# Patient Record
Sex: Male | Born: 1985 | Race: White | Hispanic: No | Marital: Single | Smoking: Never smoker
Health system: Southern US, Community
[De-identification: ages and names within clinical notes are randomized; demographics above are authoritative.]

## PROBLEM LIST (undated history)

## (undated) ENCOUNTER — Encounter
Attending: Student in an Organized Health Care Education/Training Program | Primary: Student in an Organized Health Care Education/Training Program

## (undated) ENCOUNTER — Encounter

## (undated) ENCOUNTER — Encounter: Attending: Dermatology | Primary: Dermatology

## (undated) ENCOUNTER — Telehealth

## (undated) ENCOUNTER — Ambulatory Visit: Payer: PRIVATE HEALTH INSURANCE

## (undated) ENCOUNTER — Ambulatory Visit: Attending: Dermatology | Primary: Dermatology

## (undated) ENCOUNTER — Ambulatory Visit

## (undated) ENCOUNTER — Ambulatory Visit
Attending: Student in an Organized Health Care Education/Training Program | Primary: Student in an Organized Health Care Education/Training Program

## (undated) ENCOUNTER — Encounter: Payer: PRIVATE HEALTH INSURANCE | Attending: Dermatology | Primary: Dermatology

## (undated) ENCOUNTER — Ambulatory Visit
Payer: BLUE CROSS/BLUE SHIELD | Attending: Student in an Organized Health Care Education/Training Program | Primary: Student in an Organized Health Care Education/Training Program

## (undated) ENCOUNTER — Telehealth: Attending: Dermatology | Primary: Dermatology

## (undated) ENCOUNTER — Non-Acute Institutional Stay: Payer: PRIVATE HEALTH INSURANCE

## (undated) DIAGNOSIS — F419 Anxiety disorder, unspecified: Secondary | ICD-10-CM

## (undated) DIAGNOSIS — F1010 Alcohol abuse, uncomplicated: Secondary | ICD-10-CM

## (undated) HISTORY — PX: WRIST SURGERY: SHX841

---

## 2007-01-12 HISTORY — PX: GASTRIC BYPASS: SHX52

## 2008-01-12 HISTORY — PX: BARIATRIC SURGERY: SHX1103

## 2017-09-20 ENCOUNTER — Other Ambulatory Visit: Payer: Self-pay | Admitting: Orthopaedic Surgery

## 2017-09-20 DIAGNOSIS — S52532A Colles' fracture of left radius, initial encounter for closed fracture: Secondary | ICD-10-CM

## 2017-09-21 ENCOUNTER — Ambulatory Visit
Admission: RE | Admit: 2017-09-21 | Discharge: 2017-09-21 | Disposition: A | Discharge status: Home / Self Care | Payer: Federal, State, Local not specified - PPO | Source: Ambulatory Visit | Attending: Orthopaedic Surgery | Admitting: Orthopaedic Surgery

## 2017-09-21 DIAGNOSIS — S52532A Colles' fracture of left radius, initial encounter for closed fracture: Secondary | ICD-10-CM | POA: Diagnosis not present

## 2017-09-21 DIAGNOSIS — S63012A Subluxation of distal radioulnar joint of left wrist, initial encounter: Secondary | ICD-10-CM | POA: Diagnosis not present

## 2017-09-21 DIAGNOSIS — S52571A Other intraarticular fracture of lower end of right radius, initial encounter for closed fracture: Secondary | ICD-10-CM | POA: Insufficient documentation

## 2017-09-21 DIAGNOSIS — S52611A Displaced fracture of right ulna styloid process, initial encounter for closed fracture: Secondary | ICD-10-CM | POA: Diagnosis not present

## 2017-10-28 ENCOUNTER — Encounter: Payer: Self-pay | Admitting: Emergency Medicine

## 2017-10-28 ENCOUNTER — Emergency Department
Admission: EM | Admit: 2017-10-28 | Discharge: 2017-10-28 | Disposition: A | Discharge status: Home / Self Care | Payer: Federal, State, Local not specified - PPO | Attending: Student in an Organized Health Care Education/Training Program | Admitting: Student in an Organized Health Care Education/Training Program

## 2017-10-28 ENCOUNTER — Other Ambulatory Visit: Payer: Self-pay

## 2017-10-28 DIAGNOSIS — Z046 Encounter for general psychiatric examination, requested by authority: Secondary | ICD-10-CM | POA: Insufficient documentation

## 2017-10-28 DIAGNOSIS — F419 Anxiety disorder, unspecified: Secondary | ICD-10-CM | POA: Diagnosis present

## 2017-10-28 DIAGNOSIS — Z79899 Other long term (current) drug therapy: Secondary | ICD-10-CM | POA: Insufficient documentation

## 2017-10-28 DIAGNOSIS — F410 Panic disorder [episodic paroxysmal anxiety] without agoraphobia: Secondary | ICD-10-CM | POA: Diagnosis not present

## 2017-10-28 DIAGNOSIS — G4700 Insomnia, unspecified: Secondary | ICD-10-CM | POA: Diagnosis not present

## 2017-10-28 HISTORY — DX: Anxiety disorder, unspecified: F41.9

## 2017-10-28 MED ORDER — LORAZEPAM 1 MG PO TABS
1.0000 mg | ORAL_TABLET | Freq: Once | ORAL | Status: AC
  Administered 2017-10-28: 1 mg via ORAL
  Filled 2017-10-28: qty 1

## 2017-10-28 MED ORDER — LORAZEPAM 0.5 MG PO TABS: 0.5000 mg | ORAL_TABLET | Freq: Three times a day (TID) | ORAL | 0 refills | Status: AC | PRN

## 2017-10-28 NOTE — ED Notes (Signed)

## 2017-10-28 NOTE — ED Provider Notes (Signed)
[facility] Emergency Department Provider Note    First MD Initiated Contact with Patient 10/28/17 1050     (approximate)  I have reviewed the triage vital signs and the nursing notes.   HISTORY  Chief Complaint No chief complaint on file.    HPI Hunter Holmes is a 32 y.o. male with a history of anxiety presents the ER for evaluation of panic attack.  States he has been on alprazolam for several months recently switched to Klonopin without any improvement in symptoms.  States his anxiety really flares up prior to starting work as he works as a Public relations account executive.  Denies any SI or HI.  No hallucinations.  No other substance abuse.  Would like a referral to psychiatry.    Past Medical History:  Diagnosis Date  . Anxiety    No family history on file.  There are no active problems to display for this patient.     Prior to Admission medications   Medication Sig Start Date End Date Taking? Authorizing Provider  ALPRAZolam Prudy Feeler) 0.5 MG tablet Take 0.5 mg by mouth 3 (three) times daily as needed for anxiety. 10/17/17  Yes [provider]  buPROPion (WELLBUTRIN XL) 300 MG 24 hr tablet Take 300 mg by mouth daily. 09/04/17  Yes [provider]  Cholecalciferol (VITAMIN D3) 50000 units CAPS Take 1 capsule by mouth once a week. 10/24/17  Yes [provider]  clonazePAM (KLONOPIN) 0.5 MG tablet Take 0.5 mg by mouth 2 (two) times daily as needed for anxiety.  10/24/17  Yes [provider]  furosemide (LASIX) 40 MG tablet Take 40 mg by mouth 2 (two) times daily. 09/10/17  Yes [provider]  ibuprofen (ADVIL,MOTRIN) 800 MG tablet Take 800 mg by mouth 3 (three) times daily. 09/23/17  Yes [provider]  traZODone (DESYREL) 100 MG tablet Take 100 mg by mouth at bedtime. 10/27/17  Yes [provider]  LORazepam (ATIVAN) 0.5 MG tablet Take 1 tablet (0.5 mg total) by mouth every 8 (eight) hours as needed for  anxiety. 10/28/17 10/28/18  Willy Eddy, MD    Allergies Patient has no known allergies.    Social History Social History   Tobacco Use  . Smoking status: Never Smoker  . Smokeless tobacco: Never Used  Substance Use Topics  . Alcohol use: Yes    Alcohol/week: 4.0 standard drinks    Types: 4 Shots of liquor per week    Comment: drinks daily.  . Drug use: Never    Review of Systems Patient denies headaches, rhinorrhea, blurry vision, numbness, shortness of breath, chest pain, edema, cough, abdominal pain, nausea, vomiting, diarrhea, dysuria, fevers, rashes or hallucinations unless otherwise stated above in HPI. ____________________________________________   PHYSICAL EXAM:  VITAL SIGNS: Vitals:   10/28/17 0930  BP: (!) 157/81  Pulse: 83  Temp: 98.6 F (37 C)  SpO2: 98%    Constitutional: Alert and oriented.  Eyes: Conjunctivae are normal.  Head: Atraumatic. Nose: No congestion/rhinnorhea. Mouth/Throat: Mucous membranes are moist.   Neck: No stridor. Painless ROM.  Cardiovascular: Normal rate, regular rhythm. Grossly normal heart sounds.  Good peripheral circulation. Respiratory: Normal respiratory effort.  No retractions. Lungs CTAB. Gastrointestinal: Soft and nontender. No distention. No abdominal bruits. No CVA tenderness. Genitourinary:  Musculoskeletal: No lower extremity tenderness nor edema.  No joint effusions. Neurologic:  Normal speech and language. No gross focal neurologic deficits are appreciated. No facial droop Skin:  Skin is warm, dry and intact. No  rash noted. Psychiatric: Mood and affect are normal. Speech and behavior are normal.  Denies SI or HI.  Normal thought process.  No pressured speech  ____________________________________________   LABS (all labs ordered are listed, but only abnormal results are displayed)  No results found for this or any previous visit (from the past 24  hour(s)). ____________________________________________ ____________________________________________  RADIOLOGY   ____________________________________________   PROCEDURES  Procedure(s) performed:  Procedures    Critical Care performed: no ____________________________________________   INITIAL IMPRESSION / ASSESSMENT AND PLAN / ED COURSE  Pertinent labs & imaging results that were available during my care of the patient were reviewed by me and considered in my medical decision making (see chart for details).   DDX: Anxiety, depression, withdrawal, medication effect  Hunter Holmes is a 32 y.o. who presents to the ED with symptoms as described above consistent with panic attack.  Vital signs are stable.  No other symptoms reported.  No SI or HI.  Significant improvement in symptoms after oral Ativan.  Recommend evaluation by psychiatry.  Patient requesting outpatient referral which I think is also recent will.  Does not meet criteria for IVC.  No indication for further diagnostic testing at this time given adequate outpatient follow-up and no other complaints at this time.  Have discussed with the patient and available family all diagnostics and treatments performed thus far and all questions were answered to the best of my ability. The patient demonstrates understanding and agreement with plan.       As part of my medical decision making, I reviewed the following data within the electronic MEDICAL RECORD NUMBER Nursing notes reviewed and incorporated, Labs reviewed, notes from prior ED visits and [place] Controlled Substance Database   ____________________________________________   FINAL CLINICAL IMPRESSION(S) / ED DIAGNOSES  Final diagnoses:  Anxiety      NEW MEDICATIONS STARTED DURING THIS VISIT:  New Prescriptions   LORAZEPAM (ATIVAN) 0.5 MG TABLET    Take 1 tablet (0.5 mg total) by mouth every 8 (eight) hours as needed for anxiety.     Note:  This document was prepared  using Dragon voice recognition software and may include unintentional dictation errors.    Willy Eddy, MD 10/28/17 1121

## 2017-10-28 NOTE — ED Notes (Signed)
Pt reports increased anxiety over the last couple of weeks  - PCP changed his alprazolam 0.5 tid to Clonazepam 0.5 bid and pt reports anxiety has increased since med change  Insomnia present also  Denies SI/HI  Denies AH/VH

## 2017-10-28 NOTE — ED Triage Notes (Signed)
STates he has very bad anxiety. States he has been having a panic attack all night long.  States currently taking Klonopin.  Recently changed to Klonopin from aprozolam 3 days ago.  Denies SI/ HI.  Calm and cooperative.  NAD

## 2017-10-28 NOTE — ED Notes (Signed)
First Nurse Note: Patient ambulatory to Rm 19H.  Amy Rn aware of placement.

## 2018-02-15 ENCOUNTER — Encounter
Admit: 2018-02-15 | Discharge: 2018-02-16 | Payer: PRIVATE HEALTH INSURANCE | Attending: Dermatology | Primary: Dermatology

## 2018-02-15 DIAGNOSIS — L409 Psoriasis, unspecified: Principal | ICD-10-CM

## 2018-02-15 MED ORDER — KETOCONAZOLE 2 % TOPICAL CREAM
Freq: Every day | TOPICAL | 2 refills | 0.00000 days | Status: CP
Start: 2018-02-15 — End: 2019-02-15

## 2018-02-15 MED ORDER — TRIAMCINOLONE ACETONIDE 0.1 % TOPICAL OINTMENT
Freq: Two times a day (BID) | TOPICAL | 1 refills | 0.00000 days | Status: CP
Start: 2018-02-15 — End: 2019-02-15

## 2018-03-15 ENCOUNTER — Ambulatory Visit
Admit: 2018-03-15 | Discharge: 2018-03-16 | Payer: PRIVATE HEALTH INSURANCE | Attending: Dermatology | Primary: Dermatology

## 2018-03-15 DIAGNOSIS — Z79899 Other long term (current) drug therapy: Principal | ICD-10-CM

## 2018-03-15 DIAGNOSIS — R21 Rash and other nonspecific skin eruption: Principal | ICD-10-CM

## 2018-03-15 DIAGNOSIS — L409 Psoriasis, unspecified: Principal | ICD-10-CM

## 2018-03-15 MED ORDER — CLOBETASOL 0.05 % TOPICAL OINTMENT
Freq: Two times a day (BID) | TOPICAL | 1 refills | 0 days | Status: CP
Start: 2018-03-15 — End: 2018-04-07

## 2018-03-29 DIAGNOSIS — L409 Psoriasis, unspecified: Principal | ICD-10-CM

## 2018-03-29 MED ORDER — CLOBETASOL 0.05 % TOPICAL OINTMENT
Freq: Two times a day (BID) | TOPICAL | 3 refills | 0.00000 days | Status: CP
Start: 2018-03-29 — End: 2018-04-06

## 2018-03-29 MED ORDER — TRIAMCINOLONE ACETONIDE 0.1 % TOPICAL OINTMENT
Freq: Two times a day (BID) | TOPICAL | 4 refills | 0.00000 days | Status: CP
Start: 2018-03-29 — End: 2018-04-06

## 2018-03-29 MED ORDER — ADALIMUMAB PEN CITRATE FREE 40 MG/0.4 ML
SUBCUTANEOUS | 2 refills | 0.00000 days | Status: CP
Start: 2018-03-29 — End: 2018-04-03

## 2018-03-29 NOTE — Unmapped (Signed)
Patient sent an in basket message he needed refills on Triamcinolone and Clobetasol Ointment and also start the Humira

## 2018-04-03 DIAGNOSIS — L409 Psoriasis, unspecified: Principal | ICD-10-CM

## 2018-04-03 MED ORDER — ADALIMUMAB PEN CITRATE FREE 40 MG/0.4 ML: each | 2 refills | 0 days | Status: AC

## 2018-04-03 MED ORDER — ADALIMUMAB PEN CITRATE FREE 40 MG/0.4 ML
SUBCUTANEOUS | 2 refills | 0.00000 days | Status: CP
Start: 2018-04-03 — End: 2018-04-05

## 2018-04-05 DIAGNOSIS — L409 Psoriasis, unspecified: Principal | ICD-10-CM

## 2018-04-05 MED ORDER — ADALIMUMAB PEN CITRATE FREE 40 MG/0.4 ML
PEN_INJECTOR | SUBCUTANEOUS | 6 refills | 0.00000 days | Status: CP
Start: 2018-04-05 — End: 2018-04-06

## 2018-04-05 NOTE — Unmapped (Signed)
Please call office again if this quantity is also unable to be dispensed. I am assuming 2 pens per pack, but please provide patient with 3 month supply of medication.

## 2018-04-06 DIAGNOSIS — L409 Psoriasis, unspecified: Principal | ICD-10-CM

## 2018-04-06 MED ORDER — ADALIMUMAB PEN CITRATE FREE 40 MG/0.4 ML
PEN_INJECTOR | SUBCUTANEOUS | 6 refills | 0.00000 days | Status: CP
Start: 2018-04-06 — End: ?

## 2018-04-07 ENCOUNTER — Ambulatory Visit: Admit: 2018-04-07 | Discharge: 2018-04-08 | Payer: PRIVATE HEALTH INSURANCE

## 2018-04-07 DIAGNOSIS — L409 Psoriasis, unspecified: Principal | ICD-10-CM

## 2018-04-07 MED ORDER — CLOBETASOL 0.05 % TOPICAL OINTMENT
Freq: Two times a day (BID) | TOPICAL | 1 refills | 0 days | Status: CP
Start: 2018-04-07 — End: 2018-07-12

## 2018-04-07 NOTE — Unmapped (Signed)
Dermatology Follow-Up Note    Assessment and Plan:      (1) Biopsy proven plaque psoriasis:    ?? Citrate free humira has been approved and will be delivered to his home tomorrow  ?? I spoke at length with him about how to inject the medication  ?? Continue topical treatment as needed    F/U in 6 months with Dr. Carles Collet       Subjective:     REASON FOR VISIT: established patient here for evaluation of skin rash      Tyler Bradshaw is a 33 y.o. yo male who was last seen 03/15/18.      Lesions biopsied during last visit:    Final Diagnosis   Date Value Ref Range Status   03/15/2018   Final    Right back, punch  - Psoriasis        Pt is here today for evaluation of skin rash    Rash  Location: face, trunk, extremities  Duration: years  Character: scaly, red  Treatment(s): topical steroids   Aggravating factor(s): ?   Alleviating factor(s): clobetasol has helped     Previous Treatments: above    No other concerns.     Relevant History:     Meds, Allergies, Past Medical History, Social History, Family History reviewed in Epic    Review of Systems:     The balance of 10 systems is negative unless otherwise documented.    Objective:      Physical Exam: Patient is a well developed african [place] male in no apparent distress.  Alert and oriented x 3.  Skin: Focused examination of the patient's scalp, face, neck, chest, back, bilateral upper extremities (pt declined full skin check) was performed and all areas not specifically commented on were within normal limits:  -- well-demarcated scaly red plaques on the abdomen, back, proximal and distal upper and lower extremities

## 2018-04-17 NOTE — Unmapped (Signed)
Received fax needing clarification on Tyler Bradshaw' Humira. I called the number on the fax multiple times and kept getting a wrong number signal. I then called the 800 number on the prescription and was unable to talk with anyone. I left a VM asking them to call our nurse line to let us know what needs to be clarified and the best number to reach them.

## 2018-04-20 NOTE — Unmapped (Signed)
Called [facility] again. Was again on hold for 20 minutes. Finally spoke with the pharmacist and clarified the order for Humira which will be sent out immediately.

## 2018-04-20 NOTE — Unmapped (Signed)
Received a fax requesting clarification on [name] Humira. Called [phone] and was on hold for 20 minutes. Will try again after finishing up a few other calls that won't take as long.

## 2018-04-22 ENCOUNTER — Emergency Department
Admission: EM | Admit: 2018-04-22 | Discharge: 2018-04-22 | Disposition: A | Discharge status: Home / Self Care | Payer: Federal, State, Local not specified - PPO | Attending: Student in an Organized Health Care Education/Training Program | Admitting: Student in an Organized Health Care Education/Training Program

## 2018-04-22 ENCOUNTER — Other Ambulatory Visit: Payer: Self-pay

## 2018-04-22 ENCOUNTER — Emergency Department: Payer: Federal, State, Local not specified - PPO

## 2018-04-22 DIAGNOSIS — Z20828 Contact with and (suspected) exposure to other viral communicable diseases: Secondary | ICD-10-CM | POA: Diagnosis not present

## 2018-04-22 DIAGNOSIS — R0602 Shortness of breath: Secondary | ICD-10-CM | POA: Diagnosis present

## 2018-04-22 DIAGNOSIS — Z79899 Other long term (current) drug therapy: Secondary | ICD-10-CM | POA: Diagnosis not present

## 2018-04-22 DIAGNOSIS — R69 Illness, unspecified: Secondary | ICD-10-CM

## 2018-04-22 DIAGNOSIS — J111 Influenza due to unidentified influenza virus with other respiratory manifestations: Secondary | ICD-10-CM | POA: Insufficient documentation

## 2018-04-22 LAB — COMPREHENSIVE METABOLIC PANEL
ALT: 21 U/L (ref 0–44)
AST: 31 U/L (ref 15–41)
Albumin: 4.5 g/dL (ref 3.5–5.0)
Alkaline Phosphatase: 112 U/L (ref 38–126)
Anion gap: 17 — ABNORMAL HIGH (ref 5–15)
BUN: 9 mg/dL (ref 6–20)
CO2: 26 mmol/L (ref 22–32)
Calcium: 9.1 mg/dL (ref 8.9–10.3)
Chloride: 95 mmol/L — ABNORMAL LOW (ref 98–111)
Creatinine, Ser: 0.74 mg/dL (ref 0.61–1.24)
GFR calc Af Amer: >60 mL/min (ref >60)
GFR calc non Af Amer: >60 mL/min (ref >60)
Glucose, Bld: 104 mg/dL — ABNORMAL HIGH (ref 70–99)
Potassium: 3.4 mmol/L — ABNORMAL LOW (ref 3.5–5.1)
Sodium: 138 mmol/L (ref 135–145)
Total Bilirubin: 0.6 mg/dL (ref 0.3–1.2)
Total Protein: 9.4 g/dL — ABNORMAL HIGH (ref 6.5–8.1)

## 2018-04-22 LAB — CBC WITH DIFFERENTIAL/PLATELET
Abs Immature Granulocytes: 0.02 10*3/uL (ref 0.00–0.07)
Basophils Absolute: 0 10*3/uL (ref 0.0–0.1)
Basophils Relative: 1 %
Eosinophils Absolute: 0 10*3/uL (ref 0.0–0.5)
Eosinophils Relative: 0 %
HCT: 41.4 % (ref 39.0–52.0)
Hemoglobin: 13.6 g/dL (ref 13.0–17.0)
Immature Granulocytes: 0 %
Lymphocytes Relative: 31 %
Lymphs Abs: 2 10*3/uL (ref 0.7–4.0)
MCH: 28 pg (ref 26.0–34.0)
MCHC: 32.9 g/dL (ref 30.0–36.0)
MCV: 85.2 fL (ref 80.0–100.0)
Monocytes Absolute: 0.5 10*3/uL (ref 0.1–1.0)
Monocytes Relative: 8 %
Neutro Abs: 4 10*3/uL (ref 1.7–7.7)
Neutrophils Relative %: 60 %
Platelets: 332 10*3/uL (ref 150–400)
RBC: 4.86 MIL/uL (ref 4.22–5.81)
RDW: 14.8 % (ref 11.5–15.5)
WBC: 6.6 10*3/uL (ref 4.0–10.5)
nRBC: 0 % (ref 0.0–0.2)

## 2018-04-22 LAB — INFLUENZA PANEL BY PCR (TYPE A & B)
Influenza A By PCR: NEGATIVE
Influenza B By PCR: NEGATIVE

## 2018-04-22 MED ORDER — ALPRAZOLAM 0.5 MG PO TABS
0.5000 mg | ORAL_TABLET | Freq: Once | ORAL | Status: AC
  Administered 2018-04-22: 0.5 mg via ORAL
  Filled 2018-04-22: qty 1

## 2018-04-22 MED ORDER — SODIUM CHLORIDE 0.9 % IV BOLUS
250.0000 mL | Freq: Once | INTRAVENOUS | Status: AC
  Administered 2018-04-22: 18:00:00 250 mL via INTRAVENOUS

## 2018-04-22 MED ORDER — ACETAMINOPHEN 500 MG PO TABS
1000.0000 mg | ORAL_TABLET | Freq: Once | ORAL | Status: AC
  Administered 2018-04-22: 17:00:00 1000 mg via ORAL
  Filled 2018-04-22: qty 2

## 2018-04-22 NOTE — ED Triage Notes (Addendum)
Pt arrives to ED c/o of fever this AM (low grade per pt) and SOB. Works in [employer]. 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 States BP was elevated this AM as well. C/o body aches. 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

## 2018-04-22 NOTE — Discharge Instructions (Addendum)
You have a viral illness which can have symptoms like muscle aches, fevers, chills, runny nose, cough, sneezing, sore throat, vomiting or diarrhea. One of the viruses that can cause this is SARS- CoV-2, the virus that causes COVID-19, also known as the novel coronavirus. You could also have a different viral infection such as the common cold or flu. Most patients with viral illness including COVID-19 have mild symptoms and recover on their own. Resting, staying hydrated, and sleeping are helpful. Today we think you are well enough to go home and treat your symptoms with oral liquids, and medicine for fevers, cough, and pain. ° °We generally do not do COVID-19 testing on people with mild symptoms who are being discharged from the Emergency Department or Clinic.  ° °If we did a test for COVID-19 the results will not be available for several days. We will call you with the result. Please DO NOT CONTACT THE EMERGENCY DEPARTMENT OR CLINIC FOR RESULTS OF THIS TEST.  ° °Please follow the precautions below:  °Stay home for at least 7 days after your symptoms began OR for 3 days after your fever ends, whichever takes longer. ? ° °If people live with you they should also stay home and avoid contact with others for 14 days.? ° ° °ISOLATION GUIDANCE FOR POSSIBLE COVID °Most people with cough and fever have an illness caused by a virus. One is COVID-19. Not all people with these infections are being tested for the virus that causes COVID-19. People who might have COVID but are not being tested should still try to prevent the spread of the infection. ° °These instructions are modified recommendations from the [place] Department of Health and Human Services. ° °People who might have COVID-19 should follow the instructions below until a doctor or health department says they can stop. ° °Stay home unless you need to see a doctor °Stop doing things outside your home except for getting medical care. Do not go to work, school,  or public areas; and do not use public transportation or taxis. ° °Call ahead before visiting the doctor °Before your appointment, call the doctor's office and tell them about your symptoms. This will help them take steps to keep other people from getting infected.  ° °Keep track of your symptoms °Symptoms are the things you feel, like fever or trouble breathing. Go to your doctor or the ER if you think you are getting worse, like having more trouble breathing. Call the doctor's office and tell them about your symptoms. This will help them take steps to keep other people from getting sick.  ° °Wear a face mask °You should wear a face mask that covers your nose and mouth when you are in the same room with other people and when you visit the doctor's office. People who live with you or visit you should also wear a face mask when they are in the same room with you. ° °Separate yourself from other people in your home °You should stay in a different room from other people in your home. You should stay separate from your family members as much as possible. You should use a separate bathroom if you can. ° °Avoid sharing things in your house °Don't share dishes, drinking glasses, cups, eating utensils, towels, bedding, or other things with people in your home. After using these things please wash them really well with soap and water. ° °Cover your coughs and sneezes °Cover your mouth and nose with a tissue when you   cough or sneeze, or you can cough or sneeze into your sleeve. Throw used tissues in a trash can that has a bag in it, and immediately wash your hands with soap and water for at least 20 seconds. If you use an alcohol-based hand rub please rub your hands together for 20 seconds. ° °Wash your hands °Wash your hands often and very well with soap and water for at least 20 seconds. If your hands are not visibly dirty you can use an alcohol-based hand sanitizer. Don't touch your eyes, nose, or mouth with unwashed  hands. ° ° ° °Instructions for People Helping Care for Patients with Possible COVID-19 °Follow your doctor's instructions °Make sure that you understand and can help the patient follow any instructions for care. ° °Provide for the patient's basic needs °You should help the patient with basic needs in the home and provide support for getting groceries, prescriptions, and other personal needs. ° °Keep track of the patient's symptoms °If they are getting sicker, call his or her doctor. This will help the doctor's office take steps to keep other people from getting infected. ° °Limit the number of people who have contact with the patient °If possible, have only one caregiver for the patient. °Other family members should stay in another home or place of residence. If they can't, they should stay in another room and stay separated from the patient as much as possible. °Keep one bathroom JUST for the patient if you can.  °Only allow visitors if they MUST be in the home. ° °Keep older adults, very young children, and other sick people away from the patient °Keep older adults, very young children, and people who have compromised immune systems or chronic health conditions away from the patient. This includes people with chronic heart, lung, or kidney conditions, diabetes, and cancer. ° °Wash your hands often °Avoid touching your eyes, nose, and mouth with unwashed hands. °Wash your hands often and thoroughly with soap and water for at least 20 seconds. You can use an alcohol-based hand sanitizer if soap and water are not available and if your hands are not visibly dirty.  °Use disposable paper towels to dry your hands. If not available, use dedicated cloth towels and replace them when they become wet. ° °Avoid contamination from face masks and gloves °Wear a disposable face mask and gloves whenever you are touching the patient, things in their room or bathroom, or things that can have their body fluid on them, like bedding  or dishes, or blood, vomit, urine, or feces (poop).  °Ensure the mask fits over your nose and mouth tightly, and do not touch it at all while you are wearing it. °Throw out disposable facemasks and gloves after using them. Do not reuse. °Wash your hands immediately after removing your facemask and gloves. °If your personal clothing becomes dirty with a patient's body fluids, carefully remove clothing and launder. Wash your hands after handling dirty clothing. °Place all used disposable facemasks, gloves, and other waste in a lined container before disposing them with other household garbage.  °Remove gloves and wash your hands immediately after handling these items. ° °Do not share dishes, glasses, or other household items with the patient °Avoid sharing household items. You should not share dishes, drinking glasses, cups, eating utensils, towels, bedding, or other items with a patient who is confirmed to have, or being evaluated for, COVID-19 infection.  °After the person uses these items, you should wash them very well with soap and   water. ° °Wash laundry thoroughly °Immediately remove and wash clothes or bedding that have blood, body fluids, and/or secretions or excretions, such as sweat, saliva, sputum, nasal mucus, vomit, urine, or feces, on them.  °Wear gloves when handling laundry from the patient.  °Read and follow directions on labels of laundry or clothing items and detergent. In general, wash and dry with the warmest temperatures recommended on the label. ° °Clean all areas the individual has used  °Clean all touchable surfaces, such as counters, tabletops, doorknobs, bathroom fixtures, toilets, phones, keyboards, tablets, and bedside tables, every day. Also, clean any surfaces that may have blood, body fluids, and/or secretions or excretions on them. Wear gloves when cleaning surfaces the patient has come in contact with.  °Use a diluted bleach solution (dilute bleach with 1 part bleach and 10 parts  water) or a household disinfectant with a label that says EPA-registered for coronaviruses. To make a bleach solution at home, add 1 tablespoon of bleach to 1 quart (4 cups) of water. For a larger supply, add ¼ cup of bleach to 1 gallon (16 cups) of water.  °Read labels of cleaning products and follow recommendations provided on product labels. Labels contain instructions for safe and effective use of the cleaning product including precautions you should take when applying the product, such as wearing gloves or eye protection and making sure you have good ventilation during use of the product.  °Remove gloves and wash hands immediately after cleaning. ° °Monitor yourself for signs and symptoms of illness °Caregivers and household members are considered close contacts, should monitor their health, and will be asked to limit movement outside of the home as much as possible.  ° °If you have additional questions °Contact  °Your Doctor or Healthcare Provider °The [facility] website (http://www.[facility].edu/coronavirus) °The [facility] COVID hotline 336-70COVID °Your local health department  ° °This guidance is subject to change. For the most up to date guidance, check the state Department of Health website at [facility].GOV ° °

## 2018-04-22 NOTE — ED Provider Notes (Signed)
[facility] Emergency Department Provider Note    First MD Initiated Contact with Patient 04/22/18 1640     (approximate)  I have reviewed the triage vital signs and the nursing notes.   HISTORY  Chief Complaint Shortness of Breath    HPI Hunter Holmes is a 33 y.o. male symptoms as described above.  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  States it was otherwise feeling well yesterday and then earlier today when he went to work started developing shortness of breath muscle aches some headache and exertional dyspnea.  States that he is on Humira for history of psoriasis also takes Lasix for history of lower extremity edema though he denies any history of lung or cardiac issues.  Did not take anything for fever prior to arrival.  Denies any nausea or vomiting.    Past Medical History:  Diagnosis Date  . Anxiety    History reviewed. No pertinent family history. Past Surgical History:  Procedure Laterality Date  . GASTRIC BYPASS  2009  . WRIST SURGERY     There are no active problems to display for this patient.     Prior to Admission medications   Medication Sig Start Date End Date Taking? Authorizing Provider  ALPRAZolam Prudy Feeler) 0.5 MG tablet Take 0.5 mg by mouth 3 (three) times daily as needed for anxiety. 10/17/17   [provider]  buPROPion (WELLBUTRIN XL) 300 MG 24 hr tablet Take 300 mg by mouth daily. 09/04/17   [provider]  Cholecalciferol (VITAMIN D3) 50000 units CAPS Take 1 capsule by mouth once a week. 10/24/17   [provider]  clonazePAM (KLONOPIN) 0.5 MG tablet Take 0.5 mg by mouth 2 (two) times daily as needed for anxiety.  10/24/17   [provider]  furosemide (LASIX) 40 MG tablet Take 40 mg by mouth 2 (two) times daily. 09/10/17   [provider]  ibuprofen (ADVIL,MOTRIN) 800 MG tablet Take 800 mg by mouth 3 (three) times daily. 09/23/17    [provider]  LORazepam (ATIVAN) 0.5 MG tablet Take 1 tablet (0.5 mg total) by mouth every 8 (eight) hours as needed for anxiety. 10/28/17 10/28/18  Willy Eddy, MD  traZODone (DESYREL) 100 MG tablet Take 100 mg by mouth at bedtime. 10/27/17   [provider]    Allergies Patient has no known allergies.    Social History Social History   Tobacco Use  . Smoking status: Never Smoker  . Smokeless tobacco: Never Used  Substance Use Topics  . Alcohol use: Yes    Alcohol/week: 4.0 standard drinks    Types: 4 Shots of liquor per week    Comment: drinks daily.  . Drug use: Never    Review of Systems Patient denies headaches, rhinorrhea, blurry vision, numbness, shortness of breath, chest pain, edema, cough, abdominal pain, nausea, vomiting, diarrhea, dysuria, fevers, rashes or hallucinations unless otherwise stated above in HPI. ____________________________________________   PHYSICAL EXAM:  VITAL SIGNS: Vitals:   04/22/18 1754 04/22/18 1909  BP: (!) 177/87 (!) 156/73  Pulse: (!) 110 98  Resp: 20 18  Temp:  98.3 F (36.8 C)  SpO2: 100% 99%    Constitutional: Alert and oriented.  Eyes: Conjunctivae are normal.  Head: Atraumatic. Nose: No congestion/rhinnorhea. Mouth/Throat: Mucous membranes are moist.   Neck: No stridor. Painless ROM.  Cardiovascular: mildly tachycardic, regular rhythm. Grossly normal heart sounds.  Good peripheral circulation. Respiratory: Normal respiratory effort.  No  retractions. Lungs CTAB. Gastrointestinal: Soft and nontender. No distention. No abdominal bruits. No CVA tenderness. Genitourinary: deferred Musculoskeletal: No lower extremity tenderness nor edema.  No joint effusions. Neurologic:  Normal speech and language. No gross focal neurologic deficits are appreciated. No facial droop Skin:  Skin is warm, dry and intact. No rash noted. Psychiatric: Mood and affect are normal. Speech and behavior are normal.   ____________________________________________   LABS (all labs ordered are listed, but only abnormal results are displayed)  Results for orders placed or performed during the hospital encounter of 04/22/18 (from the past 24 hour(s))  CBC with Differential/Platelet     Status: None   Collection Time: 04/22/18  4:45 PM  Result Value Ref Range   WBC 6.6 4.0 - 10.5 K/uL   RBC 4.86 4.22 - 5.81 MIL/uL   Hemoglobin 13.6 13.0 - 17.0 g/dL   HCT 45.441.4 09.839.0 - 11.952.0 %   MCV 85.2 80.0 - 100.0 fL   MCH 28.0 26.0 - 34.0 pg   MCHC 32.9 30.0 - 36.0 g/dL   RDW 14.714.8 82.911.5 - 56.215.5 %   Platelets 332 150 - 400 K/uL   nRBC 0.0 0.0 - 0.2 %   Neutrophils Relative % 60 %   Neutro Abs 4.0 1.7 - 7.7 K/uL   Lymphocytes Relative 31 %   Lymphs Abs 2.0 0.7 - 4.0 K/uL   Monocytes Relative 8 %   Monocytes Absolute 0.5 0.1 - 1.0 K/uL   Eosinophils Relative 0 %   Eosinophils Absolute 0.0 0.0 - 0.5 K/uL   Basophils Relative 1 %   Basophils Absolute 0.0 0.0 - 0.1 K/uL   Immature Granulocytes 0 %   Abs Immature Granulocytes 0.02 0.00 - 0.07 K/uL  Comprehensive metabolic panel     Status: Abnormal   Collection Time: 04/22/18  4:45 PM  Result Value Ref Range   Sodium 138 135 - 145 mmol/L   Potassium 3.4 (L) 3.5 - 5.1 mmol/L   Chloride 95 (L) 98 - 111 mmol/L   CO2 26 22 - 32 mmol/L   Glucose, Bld 104 (H) 70 - 99 mg/dL   BUN 9 6 - 20 mg/dL   Creatinine, Ser 1.300.74 0.61 - 1.24 mg/dL   Calcium 9.1 8.9 - 86.510.3 mg/dL   Total Protein 9.4 (H) 6.5 - 8.1 g/dL   Albumin 4.5 3.5 - 5.0 g/dL   AST 31 15 - 41 U/L   ALT 21 0 - 44 U/L   Alkaline Phosphatase 112 38 - 126 U/L   Total Bilirubin 0.6 0.3 - 1.2 mg/dL   GFR calc non Af Amer >60 >60 mL/min   GFR calc Af Amer >60 >60 mL/min   Anion gap 17 (H) 5 - 15  Influenza panel by PCR (type A & B)     Status: None   Collection Time: 04/22/18  5:08 PM  Result Value Ref Range   Influenza A By PCR NEGATIVE NEGATIVE   Influenza B By PCR NEGATIVE NEGATIVE    ____________________________________________ ____________________________________________  RADIOLOGY  I personally reviewed all radiographic images ordered to evaluate for the above acute complaints and reviewed radiology reports and findings.  These findings were personally discussed with the patient.  Please see medical record for radiology report.  ____________________________________________   PROCEDURES  Procedure(s) performed:  Procedures    Critical Care performed: no ____________________________________________   INITIAL IMPRESSION / ASSESSMENT AND PLAN / ED COURSE  Pertinent labs & imaging results that were available during my care of the  patient were reviewed by me and considered in my medical decision making (see chart for details).   DDX: uri, ili, pna, deydraion, covid 19  Hunter Holmes is a 33 y.o. who presents to the ED with was as described above.  Patient with low-grade temperature and symptoms concerning for viral illness.  Patient with known exposures coming from work.  Patient tested for influenza which is negative therefore will be tested for COVID-19.  Presentation complicated by patient's history of immunosuppression on Humira however his blood work and work-up in the ER is reassuring.  He is tolerating oral hydration without any hypoxia.  Chest x-ray shows no evidence of infiltrates.  I do believe he is stable and appropriate for a period of observation.  We discussed strict return precautions and signs and symptoms which he should return to the ER.  Have discussed with the patient and available family all diagnostics and treatments performed thus far and all questions were answered to the best of my ability. The patient demonstrates understanding and agreement with plan.      The patient was evaluated in Emergency Department today for the symptoms described in the history of present illness. He/she was evaluated in the context of the global COVID-19 pandemic,  which necessitated consideration that the patient might be at risk for infection with the SARS-CoV-2 virus that causes COVID-19. Institutional protocols and algorithms that pertain to the evaluation of patients at risk for COVID-19 are in a state of rapid change based on information released by regulatory bodies including the CDC and federal and state organizations. These policies and algorithms were followed during the patient's care in the ED.  As part of my medical decision making, I reviewed the following data within the electronic MEDICAL RECORD NUMBER Nursing notes reviewed and incorporated, Labs reviewed, notes from prior ED visits.   ____________________________________________   FINAL CLINICAL IMPRESSION(S) / ED DIAGNOSES  Final diagnoses:  Influenza-like illness      NEW MEDICATIONS STARTED DURING THIS VISIT:  Discharge Medication List as of 04/22/2018  7:10 PM       Note:  This document was prepared using Dragon voice recognition software and may include unintentional dictation errors.    Willy Eddy, MD 04/22/18 2045

## 2018-04-24 ENCOUNTER — Telehealth: Payer: Self-pay | Admitting: Family Medicine

## 2018-04-24 LAB — NOVEL CORONAVIRUS, NAA (HOSP ORDER, SEND-OUT TO REF LAB; TAT 18-24 HRS): SARS-CoV-2, NAA: NOT DETECTED

## 2018-04-24 NOTE — Telephone Encounter (Signed)
Pt called looking for his COVID-19 test results. It was explained to patient that his pCP will get the results and should call him with them when they are available.  Pt was advised that results could take up to 7 days. Pt verbalized understanding.

## 2018-04-25 ENCOUNTER — Telehealth: Payer: Self-pay | Admitting: Emergency Medicine

## 2018-04-25 NOTE — Telephone Encounter (Addendum)
Called patient to inform of negative covid 19 result.  Call would not go through at this time. Patient came to ed around 11am and asking for result. I gave him result and sent to [facility] so he could get copy of result for work.

## 2018-06-29 ENCOUNTER — Emergency Department
Admission: EM | Admit: 2018-06-29 | Discharge: 2018-06-29 | Disposition: A | Discharge status: Home / Self Care | Payer: Federal, State, Local not specified - PPO | Attending: Emergency Medicine | Admitting: Emergency Medicine

## 2018-06-29 ENCOUNTER — Other Ambulatory Visit: Payer: Self-pay

## 2018-06-29 DIAGNOSIS — Z5321 Procedure and treatment not carried out due to patient leaving prior to being seen by health care provider: Secondary | ICD-10-CM | POA: Diagnosis not present

## 2018-06-29 DIAGNOSIS — F410 Panic disorder [episodic paroxysmal anxiety] without agoraphobia: Secondary | ICD-10-CM | POA: Diagnosis present

## 2018-06-29 NOTE — ED Notes (Signed)
Pt sitting in lobby with no distress noted; pt reports feeling much better now with no c/o; pt feels as if he has been having panic attack for last 24hrs; taking clonazapam without relief; st seen here for same and given lorazapam with relief; st going home now due to relief of symptoms and will f/u withi his PCP in the morning for such; instr to return for any new or worsening symptoms; pt voices understanding

## 2018-06-29 NOTE — ED Triage Notes (Signed)
Pt c/o feeling shaky and anxious for the past 2 days, states "I have been having a panic attack" states he has a hx of them and is not currently on medications.

## 2018-07-12 MED ORDER — CLOBETASOL 0.05 % TOPICAL OINTMENT
Freq: Two times a day (BID) | TOPICAL | 3 refills | 0.00000 days | Status: CP
Start: 2018-07-12 — End: 2019-07-12

## 2018-07-16 MED ORDER — CLOBETASOL 0.05 % TOPICAL OINTMENT
Freq: Two times a day (BID) | TOPICAL | 1 refills | 0.00000 days | Status: CP
Start: 2018-07-16 — End: 2019-07-16

## 2018-08-16 ENCOUNTER — Encounter
Admit: 2018-08-16 | Discharge: 2018-08-17 | Payer: PRIVATE HEALTH INSURANCE | Attending: Dermatology | Primary: Dermatology

## 2018-08-16 DIAGNOSIS — L409 Psoriasis, unspecified: Principal | ICD-10-CM

## 2018-08-16 MED ORDER — TALTZ AUTOINJECTOR 80 MG/ML SUBCUTANEOUS
3 refills | 0 days | Status: CP
Start: 2018-08-16 — End: 2018-08-17

## 2018-08-16 NOTE — Unmapped (Signed)
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  Dispense: 8 Syringe; Refill: 3        High risk medication use - humira   -last quant gold neg 03/2018  -hep c/b, hiv negative on 03/2018  -denies history of chf, multiple sclerosis, malignancy, also previously discussed uncertain risk of worse outcome in setting of COVID        RTC in 3 months  ______________________________________________________________________    CC:    Chief Complaint   Patient presents with   ??? Psoriasis     states it seems to be doing better, but some days he cannot tell the difference         HPI:  This is a pleasant 32 y.o. male last seen by Dr Archie Balboa on 03/2018 who presents today for  F/up psoriasis.  Primary Concern:  Location: all over body  Duration: a few months  Treatment(s) / Modifying factor(s): humira, which he started in april   Associated symptoms/signs: itching, dark spots left over  States that the humira helped some but he continues to get new spots with a lot of itching. Denies other new, changing, non-healing, tender, or bleeding lesions.  No other skin concerns.      Pertinent PMH:  Reviewed in Epic      ROS: Baseline state of health.  Denies fevers, chills. No other skin complaints except noted per HPI.     PE:  Gen: WD, WN, NAD, A&O  Skin: Per patient request, examination of the head, neck, chest, back, abdomen,, bilateral upper extremities,and bilateral lower extremities was performed and significant for the below. All other areas examined were normal or had no significant findings.   Scaly well-demarcated plaques on the arms, legs, abdomen, back ~10% bsa  Hyperpigmented patches on the trunk arms, legs, abdomen (at sites of previous involvement)

## 2018-08-16 NOTE — Unmapped (Signed)
Per test claim for TALTZ at the [facility], patient needs Medication Assistance Program for Prior Authorization.

## 2018-08-17 MED ORDER — TALTZ AUTOINJECTOR 80 MG/ML SUBCUTANEOUS
SUBCUTANEOUS | 3 refills | 0.00000 days | Status: CP
Start: 2018-08-17 — End: ?

## 2018-09-11 NOTE — Unmapped (Signed)
Patient has called several times requesting a call back from a nurse. He states he has not gotten a call back or his meds. He [facility]  [facility] [phone] ,could a nurse please call them.

## 2018-09-11 NOTE — Unmapped (Signed)
Kia from [facility] called saying they needed a Taltz prescription for [employer]. I called [phone] option 1 and gave a verbal prescription as the original prescription had been sent on 08/17/18.

## 2018-10-03 DIAGNOSIS — L409 Psoriasis, unspecified: Secondary | ICD-10-CM

## 2018-11-27 DIAGNOSIS — L409 Psoriasis, unspecified: Principal | ICD-10-CM

## 2018-11-30 MED ORDER — TALTZ AUTOINJECTOR 80 MG/ML SUBCUTANEOUS
SUBCUTANEOUS | 11 refills | 0.00000 days | Status: CP
Start: 2018-11-30 — End: ?

## 2018-12-29 ENCOUNTER — Ambulatory Visit
Admission: EM | Admit: 2018-12-29 | Discharge: 2018-12-29 | Discharge status: Against Medical Advice | Payer: Worker's Compensation | Attending: Family Medicine | Admitting: Family Medicine

## 2018-12-29 ENCOUNTER — Encounter: Payer: Self-pay | Admitting: Emergency Medicine

## 2018-12-29 ENCOUNTER — Other Ambulatory Visit: Payer: Self-pay
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
## 2018-12-30 ENCOUNTER — Encounter (HOSPITAL_COMMUNITY): Payer: Self-pay

## 2018-12-30 ENCOUNTER — Other Ambulatory Visit: Payer: Self-pay

## 2018-12-30 ENCOUNTER — Ambulatory Visit (HOSPITAL_COMMUNITY)
Admission: EM | Admit: 2018-12-30 | Discharge: 2018-12-30 | Disposition: A | Discharge status: Home / Self Care | Payer: Worker's Compensation | Attending: Emergency Medicine | Admitting: Emergency Medicine

## 2018-12-30 ENCOUNTER — Ambulatory Visit (INDEPENDENT_AMBULATORY_CARE_PROVIDER_SITE_OTHER): Payer: Worker's Compensation

## 2018-12-30 DIAGNOSIS — S93492A Sprain of other ligament of left ankle, initial encounter: Secondary | ICD-10-CM | POA: Diagnosis not present

## 2018-12-30 DIAGNOSIS — X509XXA Other and unspecified overexertion or strenuous movements or postures, initial encounter: Secondary | ICD-10-CM | POA: Diagnosis not present

## 2018-12-30 DIAGNOSIS — I10 Essential (primary) hypertension: Secondary | ICD-10-CM

## 2018-12-30 MED ORDER — IBUPROFEN 600 MG PO TABS: 600.0000 mg | ORAL_TABLET | Freq: Four times a day (QID) | ORAL | 0 refills | Status: DC | PRN
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
## 2018-12-30 NOTE — Discharge Instructions (Addendum)
Your x-ray was negative for fracture.  Wear the ASO as needed for comfort.  Take 600 mg of ibuprofen combined with 1 g of Tylenol 3-4 times a day as needed for pain.  Continue icing and elevating her ankle.  Follow-up with [facility] health employee health and wellness located at [address], [place], [place] [address] if your ankle is still hurting in a week or 2 for reevaluation and possible referral to physical therapy.  Decrease your salt intake. diet and exercise will lower your blood pressure significantly. It is important to keep your blood pressure under good control, as having a elevated blood pressure for prolonged periods of time significantly increases your risk of stroke, heart attacks, kidney damage, eye damage, and other problems. Measure your blood pressure once a day, preferably at the same time every day. Keep a log of this and bring it to your next doctor's appointment.  Bring your blood pressure cuff as well.  Return here in 2 weeks for blood pressure recheck if you're unable to find a primary care physician by then. Return immediately to the ER if you start having chest pain, headache, problems seeing, problems talking, problems walking, if you feel like you're about to pass out, if you do pass out, if you have a seizure, or for any other concerns.  Go to www.goodrx.com to look up your medications. This will give you a list of where you can find your prescriptions at the most affordable prices. Or ask the pharmacist what the cash price is, or if they have any other discount programs available to help make your medication more affordable. This can be less expensive than what you would pay with insurance.   [phone]

## 2019-01-04 NOTE — Unmapped (Signed)
PA initiated for continued therapy of Taltz. Faxed to [facility].

## 2019-02-22 ENCOUNTER — Emergency Department: Payer: Federal, State, Local not specified - PPO

## 2019-02-22 ENCOUNTER — Emergency Department
Admission: EM | Admit: 2019-02-22 | Discharge: 2019-02-22 | Disposition: A | Discharge status: Home / Self Care | Payer: Federal, State, Local not specified - PPO | Attending: Emergency Medicine | Admitting: Emergency Medicine

## 2019-02-22 ENCOUNTER — Encounter: Payer: Self-pay | Admitting: Emergency Medicine

## 2019-02-22 DIAGNOSIS — F10239 Alcohol dependence with withdrawal, unspecified: Secondary | ICD-10-CM

## 2019-02-22 DIAGNOSIS — Z79899 Other long term (current) drug therapy: Secondary | ICD-10-CM | POA: Diagnosis not present

## 2019-02-22 DIAGNOSIS — R748 Abnormal levels of other serum enzymes: Secondary | ICD-10-CM | POA: Diagnosis present

## 2019-02-22 DIAGNOSIS — F410 Panic disorder [episodic paroxysmal anxiety] without agoraphobia: Secondary | ICD-10-CM | POA: Insufficient documentation

## 2019-02-22 DIAGNOSIS — Z20822 Contact with and (suspected) exposure to covid-19: Secondary | ICD-10-CM | POA: Insufficient documentation

## 2019-02-22 DIAGNOSIS — F419 Anxiety disorder, unspecified: Secondary | ICD-10-CM | POA: Diagnosis present

## 2019-02-22 DIAGNOSIS — F10939 Alcohol use, unspecified with withdrawal, unspecified: Secondary | ICD-10-CM

## 2019-02-22 DIAGNOSIS — F10929 Alcohol use, unspecified with intoxication, unspecified: Secondary | ICD-10-CM | POA: Diagnosis present

## 2019-02-22 LAB — CBC
HCT: 38.8 % — ABNORMAL LOW (ref 39.0–52.0)
Hemoglobin: 12.8 g/dL — ABNORMAL LOW (ref 13.0–17.0)
MCH: 28.1 pg (ref 26.0–34.0)
MCHC: 33 g/dL (ref 30.0–36.0)
MCV: 85.1 fL (ref 80.0–100.0)
Platelets: 254 10*3/uL (ref 150–400)
RBC: 4.56 MIL/uL (ref 4.22–5.81)
RDW: 16.7 % — ABNORMAL HIGH (ref 11.5–15.5)
WBC: 5.7 10*3/uL (ref 4.0–10.5)
nRBC: 0 % (ref 0.0–0.2)

## 2019-02-22 LAB — LIPASE, BLOOD: Lipase: 23 U/L (ref 11–51)

## 2019-02-22 LAB — HEPATIC FUNCTION PANEL
ALT: 40 U/L (ref 0–44)
AST: 70 U/L — ABNORMAL HIGH (ref 15–41)
Albumin: 3.9 g/dL (ref 3.5–5.0)
Alkaline Phosphatase: 56 U/L (ref 38–126)
Bilirubin, Direct: <0.1 mg/dL (ref 0.0–0.2)
Total Bilirubin: 0.6 mg/dL (ref 0.3–1.2)
Total Protein: 7.7 g/dL (ref 6.5–8.1)

## 2019-02-22 LAB — BASIC METABOLIC PANEL
Anion gap: 15 (ref 5–15)
BUN: 12 mg/dL (ref 6–20)
CO2: 25 mmol/L (ref 22–32)
Calcium: 8.6 mg/dL — ABNORMAL LOW (ref 8.9–10.3)
Chloride: 103 mmol/L (ref 98–111)
Creatinine, Ser: 0.68 mg/dL (ref 0.61–1.24)
GFR calc Af Amer: >60 mL/min (ref >60)
GFR calc non Af Amer: >60 mL/min (ref >60)
Glucose, Bld: 118 mg/dL — ABNORMAL HIGH (ref 70–99)
Potassium: 3.6 mmol/L (ref 3.5–5.1)
Sodium: 143 mmol/L (ref 135–145)

## 2019-02-22 LAB — CK: Total CK: 408 U/L — ABNORMAL HIGH (ref 49–397)

## 2019-02-22 LAB — TROPONIN I (HIGH SENSITIVITY): Troponin I (High Sensitivity): 6 ng/L (ref <18)

## 2019-02-22 LAB — RESPIRATORY PANEL BY RT PCR (FLU A&B, COVID)
Influenza A by PCR: NEGATIVE
Influenza B by PCR: NEGATIVE
SARS Coronavirus 2 by RT PCR: NEGATIVE

## 2019-02-22 LAB — ETHANOL: Alcohol, Ethyl (B): 243 mg/dL — ABNORMAL HIGH (ref <10)

## 2019-02-22 MED ORDER — THIAMINE HCL 100 MG/ML IJ SOLN: 100.0000 mg | Freq: Every day | INTRAMUSCULAR | Status: DC

## 2019-02-22 MED ORDER — CHLORDIAZEPOXIDE HCL 25 MG PO CAPS
50.0000 mg | ORAL_CAPSULE | Freq: Once | ORAL | Status: AC
  Administered 2019-02-22: 09:00:00 50 mg via ORAL
  Filled 2019-02-22: qty 2

## 2019-02-22 MED ORDER — SODIUM CHLORIDE 0.9 % IV BOLUS
1000.0000 mL | Freq: Once | INTRAVENOUS | Status: AC
  Administered 2019-02-22: 04:00:00 1000 mL via INTRAVENOUS

## 2019-02-22 MED ORDER — CLONIDINE HCL 0.1 MG PO TABS
0.1000 mg | ORAL_TABLET | Freq: Once | ORAL | Status: AC
  Administered 2019-02-22: 12:00:00 0.1 mg via ORAL
  Filled 2019-02-22: qty 1

## 2019-02-22 MED ORDER — LORAZEPAM 2 MG PO TABS
0.0000 mg | ORAL_TABLET | Freq: Four times a day (QID) | ORAL | Status: DC
  Administered 2019-02-22: 1 mg via ORAL
  Filled 2019-02-22: qty 1

## 2019-02-22 MED ORDER — LORAZEPAM 2 MG/ML IJ SOLN
1.0000 mg | Freq: Once | INTRAMUSCULAR | Status: AC
  Administered 2019-02-22: 04:00:00 1 mg via INTRAVENOUS

## 2019-02-22 MED ORDER — LORAZEPAM 2 MG/ML IJ SOLN: 0.0000 mg | Freq: Four times a day (QID) | INTRAMUSCULAR | Status: DC

## 2019-02-22 MED ORDER — LORAZEPAM 2 MG/ML IJ SOLN: 0.0000 mg | Freq: Two times a day (BID) | INTRAMUSCULAR | Status: DC

## 2019-02-22 MED ORDER — LORAZEPAM 2 MG PO TABS: 0.0000 mg | ORAL_TABLET | Freq: Two times a day (BID) | ORAL | Status: DC

## 2019-02-22 MED ORDER — THIAMINE HCL 100 MG PO TABS
100.0000 mg | ORAL_TABLET | Freq: Every day | ORAL | Status: DC
  Administered 2019-02-22: 10:00:00 100 mg via ORAL
  Filled 2019-02-22: qty 1

## 2019-02-22 MED ORDER — LORAZEPAM 2 MG/ML IJ SOLN
1.0000 mg | Freq: Once | INTRAMUSCULAR | Status: AC
  Administered 2019-02-22: 06:00:00 1 mg via INTRAVENOUS
  Filled 2019-02-22: qty 1

## 2019-02-22 MED ORDER — CHLORDIAZEPOXIDE HCL 25 MG PO CAPS: 25.0000 mg | ORAL_CAPSULE | Freq: Three times a day (TID) | ORAL | 0 refills | Status: DC | PRN

## 2019-02-22 MED ORDER — HALOPERIDOL LACTATE 5 MG/ML IJ SOLN: 5.0000 mg | Freq: Once | INTRAMUSCULAR | Status: DC

## 2019-02-22 MED ORDER — LORAZEPAM 2 MG PO TABS
2.0000 mg | ORAL_TABLET | Freq: Once | ORAL | Status: AC
  Administered 2019-02-22: 12:00:00 2 mg via ORAL
  Filled 2019-02-22: qty 1

## 2019-02-22 MED ORDER — LORAZEPAM 2 MG/ML IJ SOLN
INTRAMUSCULAR | Status: AC
  Filled 2019-02-22: qty 1

## 2019-02-22 NOTE — ED Notes (Signed)
Patient tolerated sodas well. No complaints of nausea at this time.

## 2019-02-22 NOTE — ED Notes (Signed)
Patient given ginger ale and cola per request.

## 2019-02-22 NOTE — ED Notes (Signed)
Pt called out reporting increased anxiety and feeling like his shaking and breathing were going to cause him to have a panic attack. Pt tearful and when RN instructed pt to slow his breathing pt reported he has been trying but he is getting worse. MD made aware.

## 2019-02-22 NOTE — ED Notes (Addendum)
Patient c/o increased tremors and shortness of breath. Patient agreed to leave monitoring equipment on. Patient able to control breathing in a semi-fowlers position. Patient has a fine tremor in BUE. Patient is lying on stretcher in one position, watching TV at this time. Dr. Roxan Hockey informed.

## 2019-02-22 NOTE — ED Notes (Signed)
Went into pt room to assist with blood draw. Pt was walking around room disconnected from all monitoring. Pt complied with request for him to sit and allow NT Cassie to draw blood. Blood draw was then completed.
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
## 2019-02-22 NOTE — ED Provider Notes (Signed)
[facility] Provider Note  ____________________________________________   First MD Initiated Contact with Patient 02/22/19 [phone]     (approximate)  I have reviewed the triage vital signs and the nursing notes.   HISTORY  Chief Complaint Alcohol Intoxication    HPI [facility] is a 34 y.o. male with previous history of anxiety presents emergency department via EMS with history of drinking "1 gallon of vodka today.  Patient states that he was celebrating his birthday and that he consumed a full gallon of vodka over the course of the evening.  Patient admits to feeling "shaky".  Patient states I feel poisoned".       Past Medical History:  Diagnosis Date  . Anxiety     There are no problems to display for this patient.   Past Surgical History:  Procedure Laterality Date  . GASTRIC BYPASS  2009  . WRIST SURGERY      Prior to Admission medications   Medication Sig Start Date End Date Taking? Authorizing Provider  ALPRAZolam Prudy Feeler) 0.5 MG tablet Take 0.5 mg by mouth 3 (three) times daily as needed for anxiety. 10/17/17   [provider]  buPROPion (WELLBUTRIN XL) 300 MG 24 hr tablet Take 300 mg by mouth daily. 09/04/17   [provider]  Cholecalciferol (VITAMIN D3) 50000 units CAPS Take 1 capsule by mouth once a week. 10/24/17   [provider]  clonazePAM (KLONOPIN) 0.5 MG tablet Take 0.5 mg by mouth 2 (two) times daily as needed for anxiety.  10/24/17   [provider]  furosemide (LASIX) 40 MG tablet Take 40 mg by mouth 2 (two) times daily. 09/10/17   [provider]  ibuprofen (ADVIL) 600 MG tablet Take 1 tablet (600 mg total) by mouth every 6 (six) hours as needed. 12/30/18   Domenick Gong, MD  traZODone (DESYREL) 100 MG tablet Take 100 mg by mouth at bedtime. 10/27/17   [provider]    Allergies Patient has no known allergies.  Family History  Problem Relation  Age of Onset  . Obesity Mother   . Hypertension Mother   . Other Father        unknown medical history    Social History Social History   Tobacco Use  . Smoking status: Never Smoker  . Smokeless tobacco: Never Used  Substance Use Topics  . Alcohol use: Yes    Alcohol/week: 4.0 standard drinks    Types: 4 Shots of liquor per week    Comment: weekends  . Drug use: Never    Review of Systems Constitutional: No fever/chills Eyes: No visual changes. ENT: No sore throat. Cardiovascular: Denies chest pain. Respiratory: Denies shortness of breath. Gastrointestinal: No abdominal pain.  No nausea, no vomiting.  No diarrhea.  No constipation. Genitourinary: Negative for dysuria. Musculoskeletal: Negative for neck pain.  Negative for back pain. Integumentary: Negative for rash. Neurological: Negative for headaches, focal weakness or numbness. Psychiatric:  Positive for anxiety, positive for heavy EtOH ingestion reported   ____________________________________________   PHYSICAL EXAM:  VITAL SIGNS: ED Triage Vitals  Enc Vitals Group     BP 02/22/19 0330 (!) 161/101     Pulse Rate 02/22/19 0330 (!) 108     Resp --      Temp 02/22/19 0339 98.9 F (37.2 C)     Temp Source 02/22/19 0339 Oral     SpO2 02/22/19 0330 97 %     Weight --  Height --      Head Circumference --      Peak Flow --      Pain Score --      Pain Loc --      Pain Edu? --      Excl. in [facility]? --     Constitutional: Alert and oriented.  Anxious affect Eyes: Conjunctivae are normal.  Head: Atraumatic. Mouth/Throat: Patient is wearing a mask. Neck: No stridor.  No meningeal signs.   Cardiovascular: Normal rate, regular rhythm. Good peripheral circulation. Grossly normal heart sounds. Respiratory: Tachypnea.  No retractions. Gastrointestinal: Soft and nontender. No distention.   Musculoskeletal: No lower extremity tenderness nor edema. No gross deformities of extremities. Neurologic:  Normal speech  and language. No gross focal neurologic deficits are appreciated.  Skin:  Skin is warm, dry and intact. Psychiatric: Very anxious affect speech and behavior are normal.  ____________________________________________   LABS (all labs ordered are listed, but only abnormal results are displayed)  Labs Reviewed  ETHANOL - Abnormal; Notable for the following components:      Result Value   Alcohol, Ethyl (B) 243 (*)    All other components within normal limits  BASIC METABOLIC PANEL - Abnormal; Notable for the following components:   Glucose, Bld 118 (*)    Calcium 8.6 (*)    All other components within normal limits  CBC - Abnormal; Notable for the following components:   Hemoglobin 12.8 (*)    HCT 38.8 (*)    RDW 16.7 (*)    All other components within normal limits     Procedures   ____________________________________________   INITIAL IMPRESSION / MDM / ASSESSMENT AND PLAN / ED COURSE  As part of my medical decision making, I reviewed the following data within the electronic MEDICAL RECORD NUMBER  34 year old male presented with above-stated history and physical exam consistent with acute alcohol intoxication and anxiety.  Patient's EtOH level 243.  Patient given Ativan 1 mg IV secondary to anxiety induced hyperventilation. ____________________________________________  FINAL CLINICAL IMPRESSION(S) / ED DIAGNOSES  Final diagnoses:  Alcoholic intoxication with complication ([place])  Panic attack     MEDICATIONS GIVEN DURING THIS VISIT:  Medications  sodium chloride 0.9 % bolus 1,000 mL (1,000 mLs Intravenous New Bag/Given 02/22/19 0333)  sodium chloride 0.9 % bolus 1,000 mL (1,000 mLs Intravenous New Bag/Given 02/22/19 0333)  LORazepam (ATIVAN) injection 1 mg (1 mg Intravenous Given 02/22/19 0408)     ED Discharge Orders    None      *Please note:  [facility] was evaluated in Emergency Department on 02/22/2019 for the symptoms described in the history of present illness.  He was evaluated in the context of the global COVID-19 pandemic, which necessitated consideration that the patient might be at risk for infection with the SARS-CoV-2 virus that causes COVID-19. Institutional protocols and algorithms that pertain to the evaluation of patients at risk for COVID-19 are in a state of rapid change based on information released by regulatory bodies including the CDC and federal and state organizations. These policies and algorithms were followed during the patient's care in the ED.  Some ED evaluations and interventions may be delayed as a result of limited staffing during the pandemic.*  Note:  This document was prepared using Dragon voice recognition software and may include unintentional dictation errors.   Gregor Hams, MD 02/22/19 [phone]

## 2019-02-22 NOTE — ED Provider Notes (Addendum)
Patient received in signout.  Patient becoming increasingly anxious tachycardic hypertensive.  Went to evaluate patient is protecting his airway.  Not encephalopathic.  Denies any SI or HI.  Sounds like he is got a pretty significant alcohol dependence drinking half bottle of liquor daily.  Last drink of alcohol was 8 last night.  I am concerned that he might be having early withdrawal symptoms.  Will place on [address].  Patient is requesting assistance with detox.   Willy Eddy, MD 02/22/19 [phone]   Patient becoming increasingly agitated and anxious.  Unable to sit still.  Mildly elevated CK.  Persistently tachycardic developed some tachypnea.  No hypoxia.  Receiving multiple doses of anxiolytic medication.  Suspect he will need hospitalization.   Willy Eddy, MD 02/22/19 1201  Patient spoke with hospitalist remission but was stating that he is feeling much better and would like to be discharged for outpatient follow-up.  His scores have been fairly manageable.  He is not hemodynamically unstable.  Does appear more comfortable.  Do suspect that there is a large component of anxiety contributing to his presentation.  I again recommended admission to the hospital given my concern for withdrawal.  I informed him of the risks of leaving AGAINST MEDICAL ADVICE at this time.  Those risks would include but are not limited to seizure, cardiac injury, worsening of underlying condition, worsening anxiety, or even death.  Patient was able to repeat those risks and demonstrates clear understanding of those risks but also reiterates that he feels well at this time does not feel like he needs to be admitted to the hospital.  He does appear clinically sober at this time and capable of making informed decision.  He will be leaving AGAINST MEDICAL ADVICE I will prescribe Librium taper as well as give referral to [facility].  Encouraged the patient to return to the ER should he change his mind.  He demonstrates clear  understanding for signs and symptoms of worsening withdrawal at which point he states he will come back.   Willy Eddy, MD 02/22/19 1433

## 2019-02-22 NOTE — ED Notes (Signed)
Pt standing in the doorway of his room - he is requesting apple juice and for someone to turn off the beeping in his room  - breakfast tray and apple juice provided  I could not identify the beeping in the room - after investigating the pt verbalized that he had unplugged the phillips monitor from the wall and that is what could be heard beeping  Educated him that the machines in our room are supposed to beep  - that is what alarms [place] if something is wrong  He verbalized understanding

## 2019-02-22 NOTE — ED Notes (Signed)
Patient was found with all leads, blood pressure cuff and pulse ox off again. Patient was sitting on the end of the stretcher. Patient states he feels better, but still had a fine tremor. While speaking to this Clinical research associate, patient began having full-body tremors, requested that the ED MD send him home with medication for his withdrawal and stated that he thought he was going home, but now wants to stay longer. Patient was hooked up to pulse ox only, since the patient called the leads and blood pressure cuff "restrictive." Room darkened for comfort.

## 2019-02-22 NOTE — ED Notes (Signed)
Room darkened per patient's request. 

## 2019-02-22 NOTE — ED Notes (Addendum)
Patient gagged and brought up spit after Covid test. Patient sat up on side of stretcher. Heart rate went to 120's, but came down when patient was lying back on stretcher. Dr. Roxan Hockey aware.

## 2019-02-22 NOTE — ED Notes (Signed)
Patient came to the door and was steady when standing, requesting orange juice. Patient denied nausea. Amy T RN got the patient some orange juice. Patient again requested that monitor be turned off. Patient again informed that he needed to be monitored due to Lorazepam IV administration.

## 2019-04-29 ENCOUNTER — Emergency Department
Admission: EM | Admit: 2019-04-29 | Discharge: 2019-04-30 | Disposition: A | Discharge status: Home / Self Care | Payer: Federal, State, Local not specified - PPO | Attending: Emergency Medicine | Admitting: Emergency Medicine

## 2019-04-29 ENCOUNTER — Encounter: Payer: Self-pay | Admitting: Emergency Medicine

## 2019-04-29 ENCOUNTER — Emergency Department: Payer: Federal, State, Local not specified - PPO

## 2019-04-29 ENCOUNTER — Other Ambulatory Visit: Payer: Self-pay

## 2019-04-29 DIAGNOSIS — H538 Other visual disturbances: Secondary | ICD-10-CM | POA: Diagnosis not present

## 2019-04-29 DIAGNOSIS — R251 Tremor, unspecified: Secondary | ICD-10-CM | POA: Diagnosis not present

## 2019-04-29 DIAGNOSIS — Z79899 Other long term (current) drug therapy: Secondary | ICD-10-CM | POA: Diagnosis not present

## 2019-04-29 DIAGNOSIS — F1010 Alcohol abuse, uncomplicated: Secondary | ICD-10-CM | POA: Diagnosis not present

## 2019-04-29 LAB — CBC WITH DIFFERENTIAL/PLATELET
Abs Immature Granulocytes: 0.01 10*3/uL (ref 0.00–0.07)
Basophils Absolute: 0 10*3/uL (ref 0.0–0.1)
Basophils Relative: 1 %
Eosinophils Absolute: 0 10*3/uL (ref 0.0–0.5)
Eosinophils Relative: 0 %
HCT: 40.3 % (ref 39.0–52.0)
Hemoglobin: 13.4 g/dL (ref 13.0–17.0)
Immature Granulocytes: 0 %
Lymphocytes Relative: 55 %
Lymphs Abs: 2.2 10*3/uL (ref 0.7–4.0)
MCH: 28.6 pg (ref 26.0–34.0)
MCHC: 33.3 g/dL (ref 30.0–36.0)
MCV: 85.9 fL (ref 80.0–100.0)
Monocytes Absolute: 0.6 10*3/uL (ref 0.1–1.0)
Monocytes Relative: 14 %
Neutro Abs: 1.2 10*3/uL — ABNORMAL LOW (ref 1.7–7.7)
Neutrophils Relative %: 30 %
Platelets: 251 10*3/uL (ref 150–400)
RBC: 4.69 MIL/uL (ref 4.22–5.81)
RDW: 15.3 % (ref 11.5–15.5)
WBC: 4.1 10*3/uL (ref 4.0–10.5)
nRBC: 0 % (ref 0.0–0.2)

## 2019-04-29 LAB — COMPREHENSIVE METABOLIC PANEL
ALT: 72 U/L — ABNORMAL HIGH (ref 0–44)
AST: 101 U/L — ABNORMAL HIGH (ref 15–41)
Albumin: 4.9 g/dL (ref 3.5–5.0)
Alkaline Phosphatase: 60 U/L (ref 38–126)
Anion gap: 15 (ref 5–15)
BUN: 11 mg/dL (ref 6–20)
CO2: 24 mmol/L (ref 22–32)
Calcium: 9.2 mg/dL (ref 8.9–10.3)
Chloride: 98 mmol/L (ref 98–111)
Creatinine, Ser: 0.71 mg/dL (ref 0.61–1.24)
GFR calc Af Amer: >60 mL/min (ref >60)
GFR calc non Af Amer: >60 mL/min (ref >60)
Glucose, Bld: 114 mg/dL — ABNORMAL HIGH (ref 70–99)
Potassium: 3.4 mmol/L — ABNORMAL LOW (ref 3.5–5.1)
Sodium: 137 mmol/L (ref 135–145)
Total Bilirubin: 0.7 mg/dL (ref 0.3–1.2)
Total Protein: 9 g/dL — ABNORMAL HIGH (ref 6.5–8.1)

## 2019-04-29 LAB — ETHANOL: Alcohol, Ethyl (B): <10 mg/dL (ref <10)

## 2019-04-29 MED ORDER — LORAZEPAM 2 MG/ML IJ SOLN
0.0000 mg | Freq: Four times a day (QID) | INTRAMUSCULAR | Status: DC
  Administered 2019-04-30: 2 mg via INTRAVENOUS
  Filled 2019-04-29 (×2): qty 1

## 2019-04-29 MED ORDER — SODIUM CHLORIDE 0.9 % IV BOLUS
1000.0000 mL | Freq: Once | INTRAVENOUS | Status: AC
  Administered 2019-04-29: 1000 mL via INTRAVENOUS

## 2019-04-29 MED ORDER — DIPHENHYDRAMINE HCL 50 MG/ML IJ SOLN
25.0000 mg | Freq: Once | INTRAMUSCULAR | Status: AC
  Administered 2019-04-29: 25 mg via INTRAVENOUS
  Filled 2019-04-29: qty 1

## 2019-04-29 MED ORDER — LORAZEPAM 2 MG PO TABS: 0.0000 mg | ORAL_TABLET | Freq: Two times a day (BID) | ORAL | Status: DC

## 2019-04-29 MED ORDER — METOCLOPRAMIDE HCL 5 MG/ML IJ SOLN
10.0000 mg | Freq: Once | INTRAMUSCULAR | Status: AC
  Administered 2019-04-29: 10 mg via INTRAVENOUS
  Filled 2019-04-29: qty 2

## 2019-04-29 MED ORDER — LORAZEPAM 2 MG PO TABS: 0.0000 mg | ORAL_TABLET | Freq: Four times a day (QID) | ORAL | Status: DC

## 2019-04-29 MED ORDER — LORAZEPAM 2 MG/ML IJ SOLN: 0.0000 mg | Freq: Two times a day (BID) | INTRAMUSCULAR | Status: DC

## 2019-04-29 MED ORDER — THIAMINE HCL 100 MG PO TABS: 100.0000 mg | ORAL_TABLET | Freq: Every day | ORAL | Status: DC

## 2019-04-29 MED ORDER — THIAMINE HCL 100 MG/ML IJ SOLN: 100.0000 mg | Freq: Every day | INTRAMUSCULAR | Status: DC

## 2019-04-29 MED ORDER — LORAZEPAM 2 MG/ML IJ SOLN
2.0000 mg | Freq: Once | INTRAMUSCULAR | Status: AC
  Administered 2019-04-29: 22:00:00 2 mg via INTRAVENOUS
  Filled 2019-04-29: qty 1

## 2019-04-29 NOTE — ED Notes (Addendum)
Pt reports last drink was 6 am today, and at this time pt is diaphoretic, tremulous, c/o agitation and "not feeling right," pt having vision disturbances. Pt denies ever having w/drawal seizures but is concerned for that. Pt denies N/V

## 2019-04-29 NOTE — ED Provider Notes (Signed)
[facility] EMERGENCY DEPARTMENT Provider Note   CSN: [id number] Arrival date & time: 04/29/19  2113     History Chief Complaint  Patient presents with  . Alcohol Intoxication    [facility] is a 34 y.o. male history of alcohol abuse, anxiety, who presented with possible alcohol withdrawal.  Patient states that he drinks a pint of vodka every day.  He has been trying to cut down on his drinking since yesterday.  He states that he started having diaphoresis and tremulousness.  He also has some visual disturbance as well.  Denies any history of alcohol withdrawal seizures.  Denies any suicidal homicidal ideations.  The history is provided by the patient.       Past Medical History:  Diagnosis Date  . Anxiety     Patient Active Problem List   Diagnosis Date Noted  . Alcohol intoxication (HCC) 02/22/2019  . Elevated CK 02/22/2019  . Anxiety     Past Surgical History:  Procedure Laterality Date  . GASTRIC BYPASS  2009  . WRIST SURGERY         Family History  Problem Relation Age of Onset  . Obesity Mother   . Hypertension Mother   . Other Father        unknown medical history    Social History   Tobacco Use  . Smoking status: Never Smoker  . Smokeless tobacco: Never Used  Substance Use Topics  . Alcohol use: Yes  . Drug use: Never    Home Medications Prior to Admission medications   Medication Sig Start Date End Date Taking? Authorizing Provider  acetaminophen (TYLENOL) 500 MG tablet Take 500-1,000 mg by mouth every 6 (six) hours as needed for mild pain or fever.    [provider]  chlordiazePOXIDE (LIBRIUM) 25 MG capsule Take 1 capsule (25 mg total) by mouth 3 (three) times daily as needed for anxiety. Take 3x daily for two days, then 2x daily x 2 days, then 1 daily for two days 02/22/19   Willy Eddy, MD  folic acid (FOLVITE) 1 MG tablet Take 1 mg by mouth daily. 01/18/19   [provider]  furosemide (LASIX) 40  MG tablet Take 40 mg by mouth daily as needed for fluid or edema.     [provider]  hydrochlorothiazide (HYDRODIURIL) 50 MG tablet Take 50 mg by mouth daily. 01/13/19   [provider]  LORazepam (ATIVAN) 1 MG tablet Take 1 mg by mouth 2 (two) times daily as needed for anxiety. 01/09/19   [provider]  losartan (COZAAR) 50 MG tablet Take 50 mg by mouth daily. 01/09/19   [provider]  QUEtiapine (SEROQUEL) 50 MG tablet Take 50 mg by mouth at bedtime. 12/14/18   [provider]  TALTZ 80 MG/ML SOAJ Inject 80 mg into the skin every 28 (twenty-eight) days. 02/05/19   [provider]  VIIBRYD 20 MG TABS Take 20 mg by mouth daily. 01/13/19   [provider]    Allergies    Patient has no known allergies.  Review of Systems   Review of Systems  Neurological: Positive for tremors.  Psychiatric/Behavioral: The patient is nervous/anxious.   All other systems reviewed and are negative.   Physical Exam Updated Vital Signs BP (!) 157/137   Pulse 84   Temp 98.7 F (37.1 C) (Oral)   Resp 18   Ht 5\' 9"  (1.753 m)   Wt (!) 158.8 kg   SpO2 98%  BMI 51.69 kg/m   Physical Exam Vitals and nursing note reviewed.  Constitutional:      Comments: Anxious, diaphoretic   HENT:     Head: Normocephalic.     Nose: Nose normal.     Mouth/Throat:     Mouth: Mucous membranes are dry.  Eyes:     Extraocular Movements: Extraocular movements intact.     Pupils: Pupils are equal, round, and reactive to light.  Cardiovascular:     Rate and Rhythm: Normal rate and regular rhythm.     Pulses: Normal pulses.     Heart sounds: Normal heart sounds.  Pulmonary:     Effort: Pulmonary effort is normal.     Breath sounds: Normal breath sounds.  Abdominal:     General: Abdomen is flat.     Palpations: Abdomen is soft.  Musculoskeletal:        General: Normal range of motion.     Cervical back: Normal range of motion.  Skin:    General: Skin  is warm.     Capillary Refill: Capillary refill takes less than 2 seconds.  Neurological:     General: No focal deficit present.     Mental Status: He is oriented to person, place, and time.  Psychiatric:     Comments: Anxious      ED Results / Procedures / Treatments   Labs (all labs ordered are listed, but only abnormal results are displayed) Labs Reviewed  CBC WITH DIFFERENTIAL/PLATELET - Abnormal; Notable for the following components:      Result Value   Neutro Abs 1.2 (*)    All other components within normal limits  COMPREHENSIVE METABOLIC PANEL - Abnormal; Notable for the following components:   Potassium 3.4 (*)    Glucose, Bld 114 (*)    Total Protein 9.0 (*)    AST 101 (*)    ALT 72 (*)    All other components within normal limits  ETHANOL    EKG EKG Interpretation  Date/Time:  Sunday April 29 2019 21:19:07 EDT Ventricular Rate:  87 PR Interval:  162 QRS Duration: 92 QT Interval:  360 QTC Calculation: 433 R Axis:   60 Text Interpretation: Normal sinus rhythm Normal ECG When compared with ECG of 22-Feb-2019 12:03, PREVIOUS ECG IS PRESENT poor baseline Confirmed by Wandra Arthurs [phone]) on 04/29/2019 9:51:24 PM   Radiology No results found.  Procedures Procedures (including critical care time)  Medications Ordered in ED Medications  sodium chloride 0.9 % bolus 1,000 mL (1,000 mLs Intravenous New Bag/Given 04/29/19 2202)  LORazepam (ATIVAN) injection 2 mg (2 mg Intravenous Given 04/29/19 2200)    ED Course  I have reviewed the triage vital signs and the nursing notes.  Pertinent labs & imaging results that were available during my care of the patient were reviewed by me and considered in my medical decision making (see chart for details).    MDM Rules/Calculators/A&P                      [facility] is a 34 y.o. male here presenting with diaphoresis, tremors, visual disturbance when he tried to cut down his alcohol.  Concerned that he may be in alcohol  withdrawal.  Will get CIWA and give Ativan and hydrate patient.  10:46 PM Initial CIWA was 25.  Alcohol level still pending.  Patient is given IV fluids and Ativan.  Just reassessed him and he states that he felt slightly better.  He  still has some blurry vision headache.  This time, will order CT head and migraine cocktail.  11:12 PM  ETOH negative. CIWA down to 13. TTS consult pending. Signed out to Dr. Manson Passey in the ED.    Final Clinical Impression(s) / ED Diagnoses Final diagnoses:  None    Rx / DC Orders ED Discharge Orders    None       Charlynne Pander, MD 04/29/19 2313

## 2019-04-29 NOTE — ED Notes (Signed)
ED Provider at bedside. 

## 2019-04-29 NOTE — ED Triage Notes (Signed)
Patient states that he drink about half gallon a day and started trying to detox himself. Patient states that his last drink was about 06:00 this am. Patient states that his vision is blurry and that he just doesn't feel right. Patient diaphoretic and trembling.

## 2019-04-30 MED ORDER — CHLORDIAZEPOXIDE HCL 25 MG PO CAPS
25.0000 mg | ORAL_CAPSULE | Freq: Once | ORAL | Status: AC
  Administered 2019-04-30: 25 mg via ORAL
  Filled 2019-04-30: qty 1

## 2019-04-30 NOTE — ED Provider Notes (Signed)
TTS has consulted with Doctor Manson Passey to review the pt and the pts appropriateness for detox referral.  Pt has been medically cleared.   Referral information for detox admission faxed to;    [facility] [phone] -or- [phone]),    . [facility] [phone])

## 2019-08-02 ENCOUNTER — Other Ambulatory Visit: Payer: Self-pay

## 2019-08-02 ENCOUNTER — Ambulatory Visit: Payer: Federal, State, Local not specified - PPO | Admitting: Physician Assistant

## 2019-08-02 DIAGNOSIS — A539 Syphilis, unspecified: Secondary | ICD-10-CM

## 2019-08-02 DIAGNOSIS — Z113 Encounter for screening for infections with a predominantly sexual mode of transmission: Secondary | ICD-10-CM

## 2019-08-02 MED ORDER — PENICILLIN G BENZATHINE 1200000 UNIT/2ML IM SUSP
2.4000 10*6.[IU] | Freq: Once | INTRAMUSCULAR | Status: AC
  Administered 2019-08-02: 2.4 10*6.[IU] via INTRAMUSCULAR

## 2019-08-02 NOTE — Progress Notes (Signed)
Pt states he needs tx for syphilis. Administered 2.4 MU Bicillin LA per Sadie Haber, PA order. Provider orders completed.

## 2019-08-03 ENCOUNTER — Encounter: Payer: Self-pay | Admitting: Physician Assistant

## 2019-08-03 DIAGNOSIS — A539 Syphilis, unspecified: Secondary | ICD-10-CM | POA: Insufficient documentation

## 2019-08-03 NOTE — Progress Notes (Signed)
   [facility] Department STI clinic/screening visit  Subjective:  Abdirizak Richison is a 34 y.o. male being seen today for an STI screening visit. The patient reports they do not have symptoms.    Patient has the following medical conditions:   Patient Active Problem List   Diagnosis Date Noted  . Alcohol intoxication (HCC) 02/22/2019  . Elevated CK 02/22/2019  . Anxiety      Chief Complaint  Patient presents with  . SEXUALLY TRANSMITTED DISEASE    pt states needs tx for syphilis    HPI  Patient reports that he was seen by his PCP last week on Thursday and screened for STDs.  States that he was called on Tuesday and told that his test for Syphilis was positive and to come here for treatment.  States that he was given a Z pack by PCP for a sore throat and his last dose was 07/23/2019.  Reports history of Anxiety and Depression for which he takes medicine and that he is in recovery for alcohol.   Per consult with Wendi Snipes, RN who looked up patient labs on [facility], patient with reactive RPR at 1:64, and EIA reactive.     See flowsheet for further details and programmatic requirements.    The following portions of the patient's history were reviewed and updated as appropriate: allergies, current medications, past medical history, past social history, past surgical history and problem list.  Objective:  There were no vitals filed for this visit.  Physical Exam Constitutional:      General: He is not in acute distress.    Appearance: Normal appearance.  HENT:     Head: Normocephalic and atraumatic.  Eyes:     Conjunctiva/sclera: Conjunctivae normal.  Pulmonary:     Effort: Pulmonary effort is normal.  Neurological:     Mental Status: He is alert and oriented to person, place, and time.  Psychiatric:        Mood and Affect: Mood normal.        Behavior: Behavior normal.        Thought Content: Thought content normal.        Judgment: Judgment normal.        Assessment and Plan:  Mieczyslaw Stamas is a 34 y.o. male presenting to the [facility] Department for STI screening  1. Screening for STD (sexually transmitted disease) Patient declines screening today stating that he was screened for everything at his PCP office and the only test that was positive was for Syphilis. Rec condoms with all sex. Wendi Snipes, RN spoke with patient and will make referral to [facility] for patient to discuss PrEP.  2. Syphilis Treat for Syphilis with Bicillin 2.4 mu IM today. No sex for 14 days and until after partner completes treatment. Counseled patient re:  Syphilis- dz, treatment, and sequelae if left untreated. RTC in 6 months, 12 months and annually for titer monitoring. - penicillin g benzathine (BICILLIN LA) 1200000 UNIT/2ML injection 2.4 Million Units     No follow-ups on file.  No future appointments.  Matt Holmes, PA

## 2019-08-06 ENCOUNTER — Telehealth: Payer: Self-pay | Admitting: Family Medicine

## 2019-08-06 NOTE — Telephone Encounter (Signed)
Call to patient to inform of need for additional treatment.  Patient does not have proof of a previous negative test result.  Patient verbalizes understanding.  Appointment scheduled. Wendi Snipes, RN

## 2019-08-09 ENCOUNTER — Ambulatory Visit: Payer: Self-pay | Admitting: Family Medicine

## 2019-08-09 ENCOUNTER — Other Ambulatory Visit: Payer: Self-pay

## 2019-08-09 DIAGNOSIS — A539 Syphilis, unspecified: Secondary | ICD-10-CM

## 2019-08-09 MED ORDER — PENICILLIN G BENZATHINE 1200000 UNIT/2ML IM SUSP
2.4000 10*6.[IU] | Freq: Once | INTRAMUSCULAR | Status: AC
  Administered 2019-08-09: 2.4 10*6.[IU] via INTRAMUSCULAR

## 2019-08-09 NOTE — Progress Notes (Signed)
Patient in clinic today for treatment of syphilis.  2.4 million units Bicilin IM given per verbal order of K. Newton. MD.  Treatment given with Exie Parody, RN.  Patient tolerated treatment well.  Next appointment scheduled for 08/16/2019.  Patient to call if questions or concerns.  Wendi Snipes, RN

## 2019-08-14 NOTE — Progress Notes (Signed)
Attestation of Attending Supervision of clinical support staff: I agree with the care provided to this patient and was available for any consultation.  I have reviewed the RN's note and chart. I was consulted and provided the verbal order for bicillin administration.    Federico Flake, MD, MPH, ABFM Attending Physician Faculty Practice- [facility]

## 2019-08-16 ENCOUNTER — Other Ambulatory Visit: Payer: Self-pay

## 2019-08-16 ENCOUNTER — Ambulatory Visit: Payer: Self-pay | Admitting: Physician Assistant

## 2019-08-16 DIAGNOSIS — A539 Syphilis, unspecified: Secondary | ICD-10-CM

## 2019-08-16 MED ORDER — PENICILLIN G BENZATHINE 1200000 UNIT/2ML IM SUSP
2.4000 10*6.[IU] | Freq: Once | INTRAMUSCULAR | Status: AC
  Administered 2019-08-16: 2.4 10*6.[IU] via INTRAMUSCULAR

## 2019-08-16 NOTE — Progress Notes (Signed)
Reviewed record and patient not able to provide proof of a negative Syphilis serology within the last year.  Patient here today for treatment #3 of 3 for Syphilis today.  Patient to RTC for titer check in 6 months, 12 months and then annually.

## 2019-08-16 NOTE — Progress Notes (Signed)
Provider orders completed. 

## 2019-09-22 NOTE — Progress Notes (Signed)
ERRN note reviewed by MD and attestation signed.  Will sign document for MD since currently on leave.

## 2019-09-24 ENCOUNTER — Ambulatory Visit: Payer: Federal, State, Local not specified - PPO

## 2019-11-07 DIAGNOSIS — L409 Psoriasis, unspecified: Principal | ICD-10-CM

## 2019-11-07 MED ORDER — TALTZ AUTOINJECTOR 80 MG/ML SUBCUTANEOUS
0 refills | 0 days
Start: 2019-11-07 — End: ?

## 2019-11-20 ENCOUNTER — Other Ambulatory Visit: Payer: Self-pay

## 2019-11-20 ENCOUNTER — Ambulatory Visit: Payer: Self-pay | Admitting: Family Medicine

## 2019-11-20 ENCOUNTER — Encounter: Payer: Self-pay | Admitting: Family Medicine

## 2019-11-20 VITALS — BP 138/86 | HR 60 | Resp 12 | Ht 69.0 in | Wt >= 6400 oz

## 2019-11-20 DIAGNOSIS — F1090 Alcohol use, unspecified, uncomplicated: Secondary | ICD-10-CM

## 2019-11-20 DIAGNOSIS — F10939 Alcohol use, unspecified with withdrawal, unspecified: Secondary | ICD-10-CM

## 2019-11-20 DIAGNOSIS — Z113 Encounter for screening for infections with a predominantly sexual mode of transmission: Secondary | ICD-10-CM

## 2019-11-20 DIAGNOSIS — F419 Anxiety disorder, unspecified: Secondary | ICD-10-CM

## 2019-11-20 DIAGNOSIS — F10239 Alcohol dependence with withdrawal, unspecified: Secondary | ICD-10-CM

## 2019-11-20 DIAGNOSIS — Z789 Other specified health status: Secondary | ICD-10-CM

## 2019-11-20 NOTE — Progress Notes (Signed)
[facility] Department STI clinic/screening visit  Subjective:  Hunter Holmes is a 34 y.o. male being seen today for an STI screening visit. The patient reports they do have symptoms.    Patient has the following medical conditions:   Patient Active Problem List   Diagnosis Date Noted  . Syphilis 08/03/2019  . Alcohol intoxication ([place]) 02/22/2019  . Elevated CK 02/22/2019  . Anxiety      Chief Complaint  Patient presents with  . SEXUALLY TRANSMITTED DISEASE    screening     HPI  Patient in clinic today reporting feeling fatigued and decrease in appetite x 2 weeks, diarrhea w/in 30 mins of eating x 1-2 days and white coating on tongue x 1-2 days.  Patient reports hx of anxiety and high ETOH usage daily.    See flowsheet for further details and programmatic requirements.    The following portions of the patient's history were reviewed and updated as appropriate: allergies, current medications, past medical history, past social history, past surgical history and problem list.  Objective:   Vitals:   11/20/19 1718  BP: 138/86  Pulse: 60  Resp: 12  Weight: (!) 423 lb (191.9 kg)  Height: _0  (1.753 m)    Physical Exam Vitals reviewed.  Constitutional:      Appearance: He is obese. He is ill-appearing.     Comments: agitation and overall tremors   HENT:     Head: Normocephalic.     Comments: In scalp, brows and lashes: no nits, no hair loss. Hair cut low.        Mouth/Throat:     Mouth: Mucous membranes are moist.     Comments: Poor dental care.  Multiple missing teeth and dental carries present.    White plaque consistent with thrush on tongue.   Genitourinary:    Rectum: Normal.     Comments: No lice, nits, or pest, no lesions or odor discharge.  Denies pain or tenderness with paplation of testicles.  No lesions, ulcers or masses present.   Musculoskeletal:     Cervical back: Normal range of motion.  Lymphadenopathy:     Cervical: No cervical  adenopathy.  Skin:    General: Skin is warm and dry.     Findings: No bruising, erythema or rash.       Neurological:     Mental Status: He is alert.  Psychiatric:     Comments: Patient agitated and increased anxiety.          Assessment and Plan:  Hunter Holmes is a 34 y.o. male presenting to the [facility] Department for STI screening  1. Screening for STD (sexually transmitted disease)  - Syphilis Serology, [place] [facility] - Chlamydia/Gonorrhea [place] [facility] - Chlamydia/Gonorrhea [place] [facility] - HBV Antigen/Antibody [place] [facility] - HIV/HCV [place] [facility]  Patient does not have STI symptoms.  Patient accepted all screenings including  Urine,oral, rectal CT/GC and bloodwork for HCV, HBV, HIV/RPR.   Patient meets criteria for HepB screening? Yes. Ordered? Yes Patient meets criteria for HepC screening? Yes. Ordered? Yes  Discussed with patient about symptoms consistent with  Possible acute HIV infection.  Counseled about labs drawn today and will discussed further recommendations once TR area back (PreP vs HIV care).    Recommended no sex until TR are back, then  condom use with all sex. Discussed importance of condom use for STI prevent  Discussed time line for [facility]  Lab results and that patient will be called with positive results and encouraged patient to call if he had not heard in 2 weeks.   Recommended returning for continued or worsening symptoms.   2. Alcohol withdrawal syndrome with complication [facility]) Patient in clinic with overall tremors and signs of fatigue at beginning of visit. Patient encouraged to seek medical for withdrawals at either [facility] or [facility].  Patient declines at this time.  Discussed with patient the importance of medical detox d/t amount of ETOH consumption on daily/weekly basis.     before patient departure,  reports that he feels less tremors and wanted to go home and "lay down" , reinforced need for hospitalization.   Patient verbalizes understanding.    3. Heavy alcohol use  Patient reports drinking ~1 gallon of vodka on most days.  Patient says he changed from drinking vodka  to drinking [place] because its not as bad.    Discussed with patient about AA, states that AA worked for about 45 days. But relapsed when he met with a friend.    Patient also reports the he has had 2 inpatient rehab visits in 2020 and 2021 but "they only took my money and did not help".    Discussed that hospital monitored detox maybe better option.  Patient declined hospital at this time and verbalizes understanding.     4. Anxiety Hx of anxiety, patient is prescribed xanax, patient reports that he takes medications as prescribed.  Discussed with patient about drinking affecting medication MOA and causing reverse effects of medication.  Recommend to reach out to PCP and notify of drinking and how medication is working for him.  Patient verbalized understanding.  Patient given cardinal card to call if needs help before able to contact PCP.      Return for as needed.  No future appointments.  Junious Dresser, FNP

## 2019-12-11 DIAGNOSIS — L409 Psoriasis, unspecified: Principal | ICD-10-CM

## 2019-12-11 MED ORDER — TALTZ AUTOINJECTOR 80 MG/ML SUBCUTANEOUS
0 refills | 0 days
Start: 2019-12-11 — End: ?

## 2019-12-11 NOTE — Unmapped (Signed)
Pt needs an appt, he was seen last on 08/16/18

## 2019-12-20 ENCOUNTER — Encounter
Admit: 2019-12-20 | Discharge: 2019-12-21 | Payer: PRIVATE HEALTH INSURANCE | Attending: Dermatology | Primary: Dermatology

## 2019-12-20 DIAGNOSIS — Z79899 Other long term (current) drug therapy: Principal | ICD-10-CM

## 2019-12-20 DIAGNOSIS — L409 Psoriasis, unspecified: Principal | ICD-10-CM

## 2019-12-20 MED ORDER — TALTZ AUTOINJECTOR 80 MG/ML SUBCUTANEOUS
SUBCUTANEOUS | 11 refills | 0.00000 days | Status: CP
Start: 2019-12-20 — End: 2019-12-31

## 2019-12-20 NOTE — Unmapped (Signed)
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 Failed humira.  -cont ixekizumab (TALTZ AUTOINJECTOR) 80 mg/mL AtIn; SubQ: 160 mg once, followed by 80 mg at weeks 2, 4, 6, 8, 10 and 12, and then 80 mg every 4 weeks.  Dispense: 8 Syringe; Refill: 3    High risk medication use - taltz  -last quant gold neg 03/2018 - repeat today  -hep c/b, hiv negative on 03/2018  -denies history of chf, multiple sclerosis, malignancy  -hold for 1-2 weeks after any vaccine        I spent 10 minutes on the real-time audio and video with the patient on the date of service. I spent an additional 6 minutes on pre- and post-visit activities on the date of service.     The patient was physically located in [place] or a state in which I am permitted to provide care. The patient and/or parent/guardian understood that s/he may incur co-pays and cost sharing, and agreed to the telemedicine visit. The visit was reasonable and appropriate under the circumstances given the patient's presentation at the time.    The patient and/or parent/guardian has been advised of the potential risks and limitations of this mode of treatment (including, but not limited to, the absence of in-person examination) and has agreed to be treated using telemedicine. The patient's/patient's family's questions regarding telemedicine have been answered.     If the visit was completed in an ambulatory setting, the patient and/or parent/guardian has also been advised to contact their provider???s office for worsening conditions, and seek emergency medical treatment and/or call 911 if the patient deems either necessary.        RTC in 3 months  ______________________________________________________________________    CC:    Chief Complaint   Patient presents with   ??? Psoriasis     pt here for f/u Prosiasis, no new concerns   ??? consent     Pt gives consent for VV and will be in [place]          HPI:  This is a pleasant 34 y.o. male last seen bymyself on 08/2018 who presents today for  F/up psoriasis.    Patient notes that he has cleared up so much on the taltz. If he gets a cold he feels like he has more down time but overall very happy with his treatment.      Pertinent PMH:  Reviewed in Epic      ROS: Baseline state of health.  Denies fevers, chills. No other skin complaints except noted per HPI.     PE:  Gen: WD, WN, NAD, A&O  Skin: Per patient request, examination of the head was performed and significant for the below. All other areas examined were normal or had no significant findings.   EXAM DONE OVER VIDEO, NO PHOTOS PROVIDED  Face clear

## 2019-12-21 ENCOUNTER — Telehealth: Payer: Self-pay | Admitting: Family Medicine

## 2019-12-21 NOTE — Telephone Encounter (Signed)
Meteea Please call me as soon as you can.

## 2019-12-23 ENCOUNTER — Encounter: Payer: Self-pay | Admitting: Family Medicine

## 2019-12-31 DIAGNOSIS — L409 Psoriasis, unspecified: Principal | ICD-10-CM

## 2019-12-31 NOTE — Unmapped (Signed)
Refill request [id number]  Patient last seen through telemedicine: 12/20/19  Refill sent for your review

## 2020-01-01 MED ORDER — TALTZ AUTOINJECTOR 80 MG/ML SUBCUTANEOUS
SUBCUTANEOUS | 11 refills | 0.00000 days | Status: CP
Start: 2020-01-01 — End: ?

## 2020-01-01 NOTE — Unmapped (Signed)
Patient understands he needs to obtain quant gold prior to additional doses.

## 2020-04-16 IMAGING — DX DG CHEST 1V PORT
1 series · 2 of 2 positions shown · non-contrast
Comparison: 04/22/2018

CLINICAL DATA: Shortness of breath

EXAM:
PORTABLE CHEST 1 VIEW

[Series 1: chest ap · 0.14mm/px · 2 of 2 slices shown]
[im 1/2]
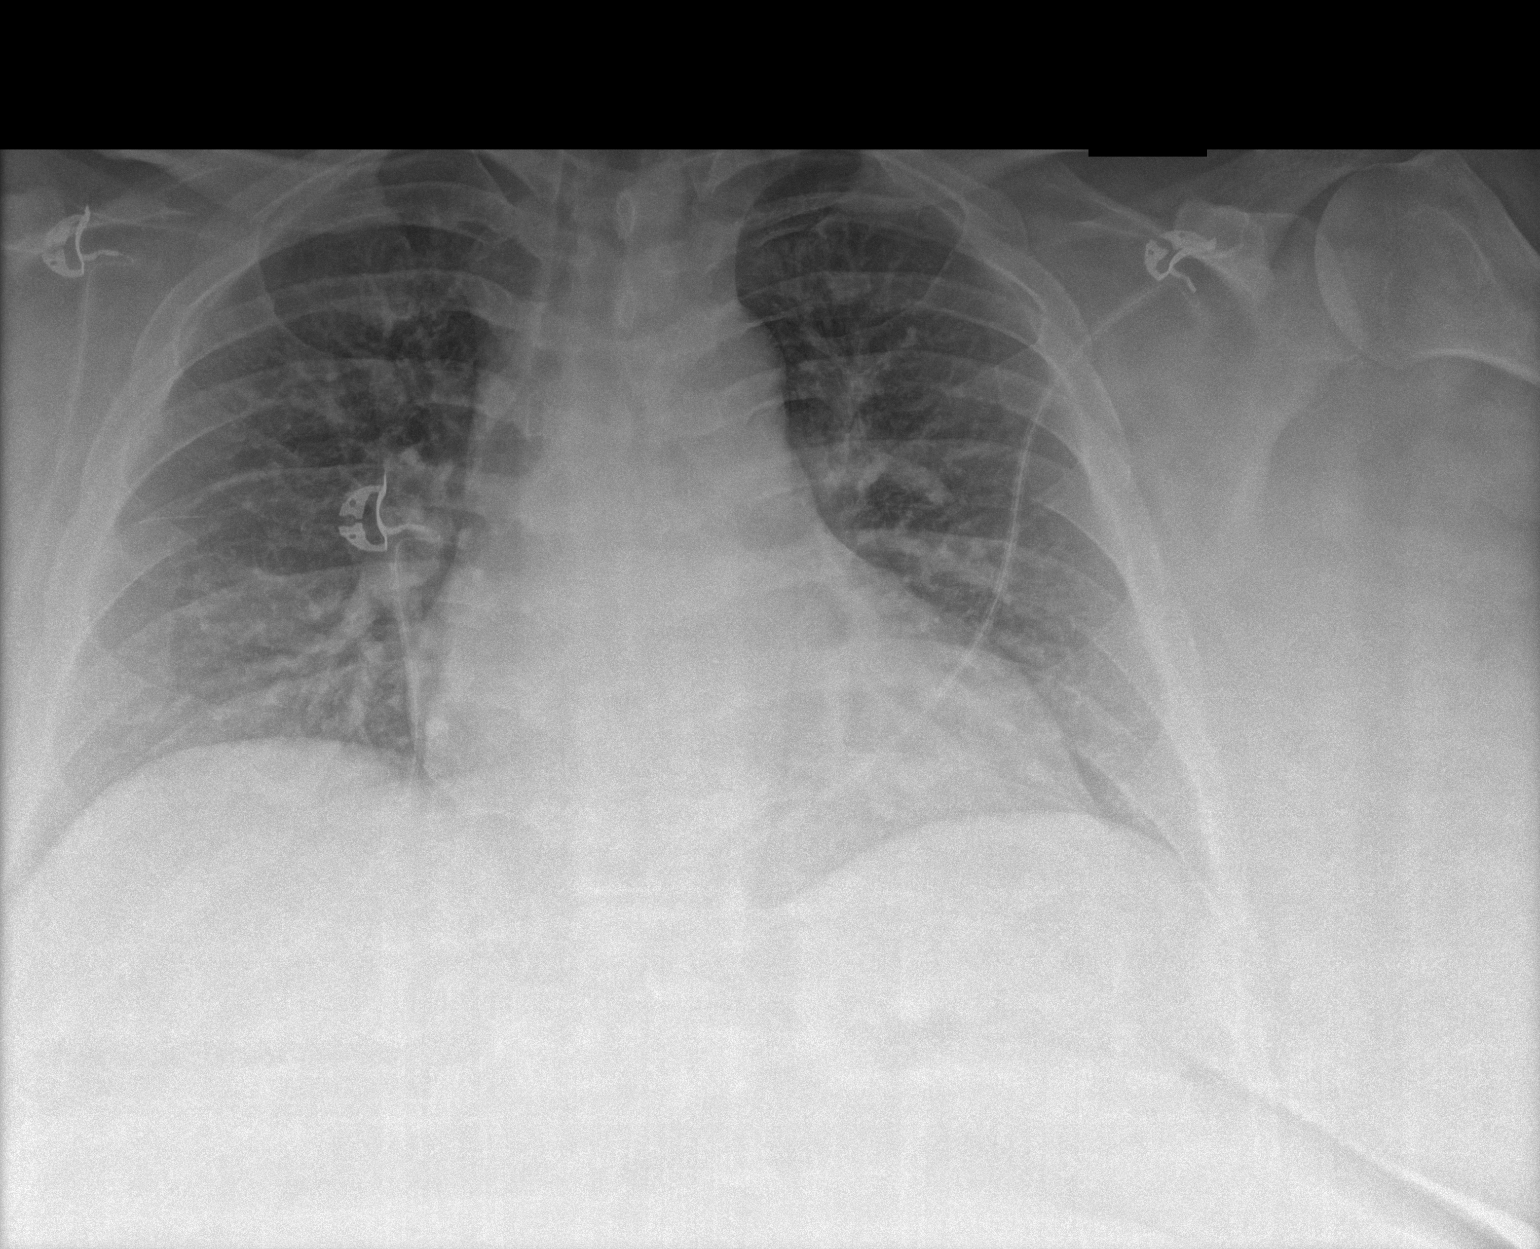
[im 2/2]
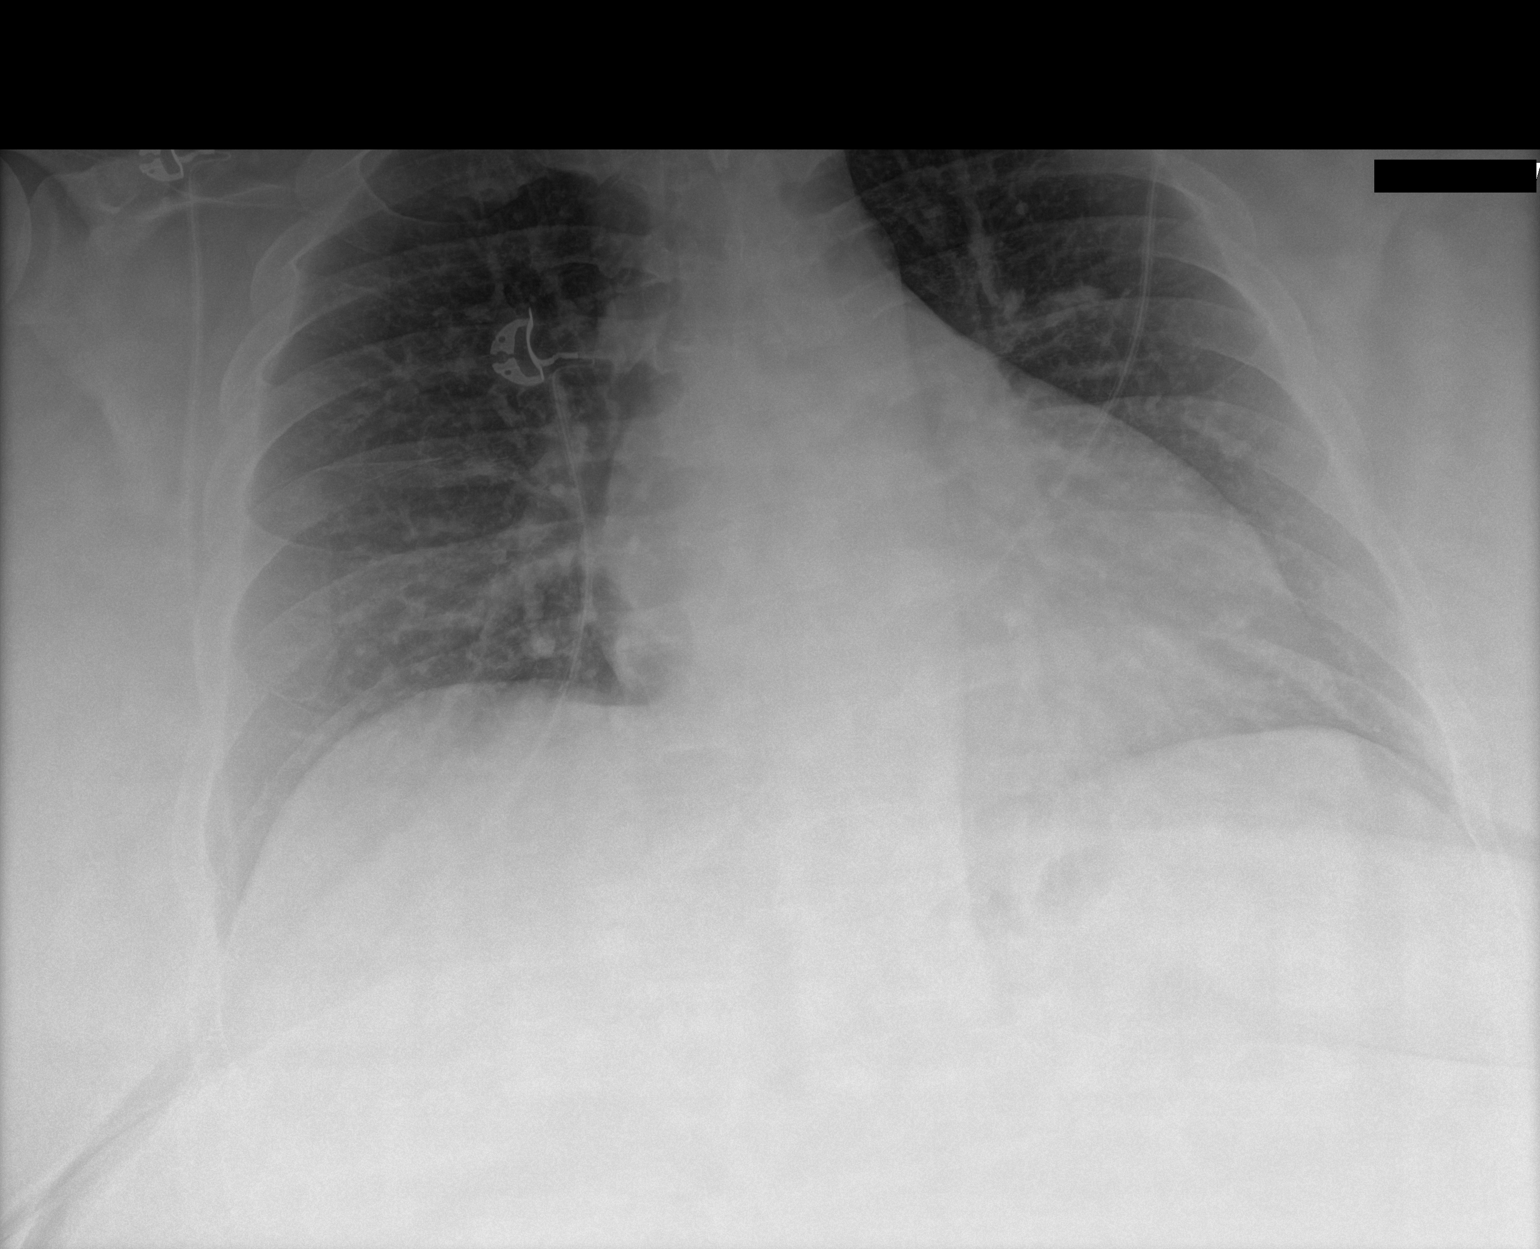

[2 of 2 positions shown; findings below may reference images not displayed]

FINDINGS: Cardiac shadow is within normal limits. The lungs are well aerated
bilaterally. No focal infiltrate or sizable effusion is noted. No
bony abnormality is seen.
IMPRESSION: No acute abnormality seen.

## 2020-06-21 IMAGING — CT CT HEAD W/O CM
4 series · 16 of 47 positions shown, 18 images · non-contrast
Comparison: None.

CLINICAL DATA: Blurred vision

EXAM:
CT HEAD WITHOUT CONTRAST
TECHNIQUE: Contiguous axial images were obtained from the base of the skull
through the vertex without intravenous contrast.

[Series 2: head bone · axial · 0.43mm/px · z∈[-202,-174]mm · 3 of 72 slices shown]
[im 8/72  bone]
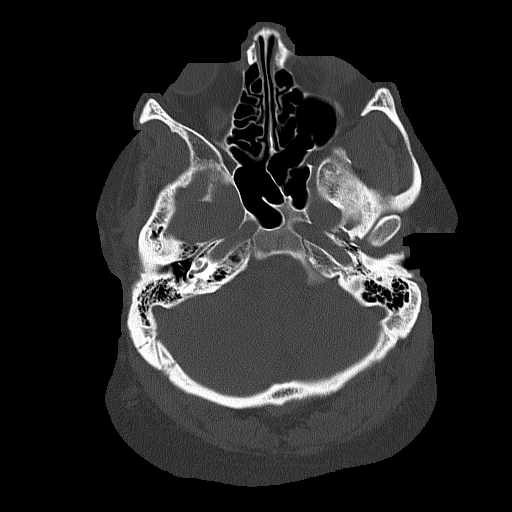
[im 15/72  bone]
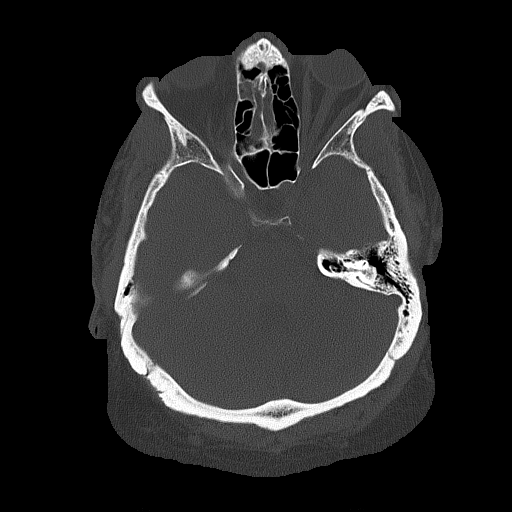
[im 22/72  bone]
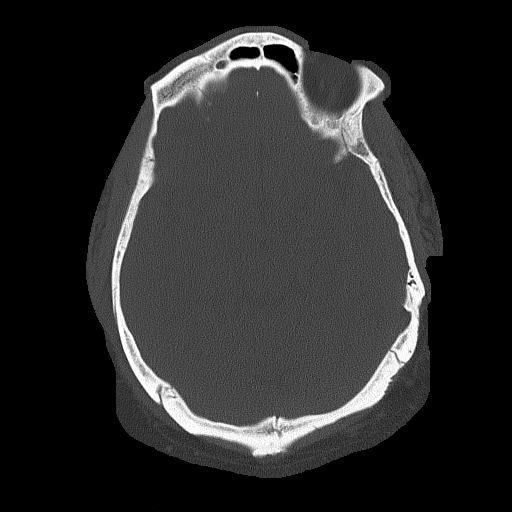

[Series 3: head wo · axial · 0.43mm/px · z∈[-201,-96]mm · 7 of 29 slices shown, 9 images]
[im 4/29  brain]
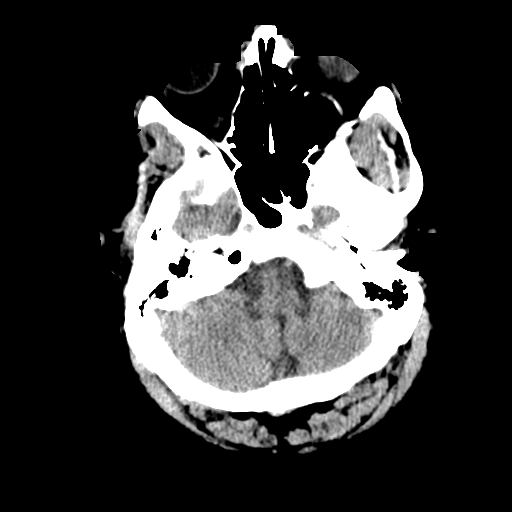
[im 4/29  bone]
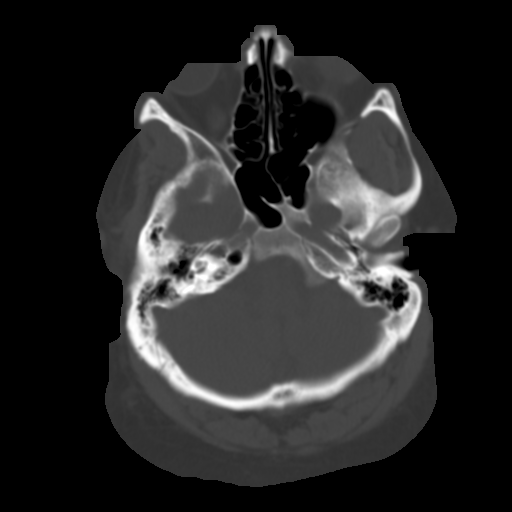
[im 8/29  brain]
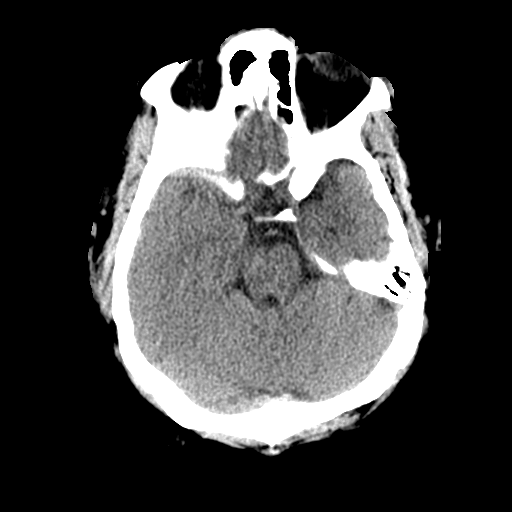
[im 11/29  brain]
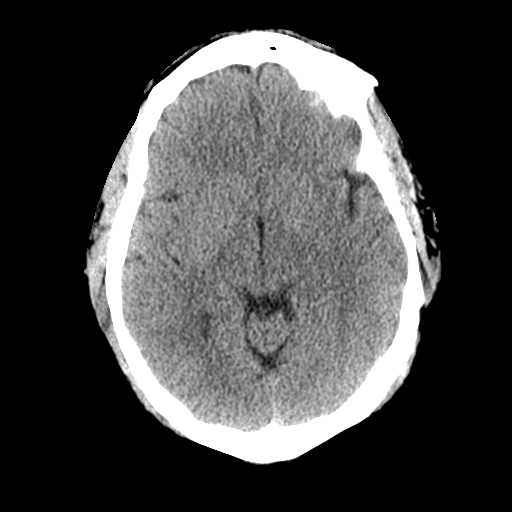
[im 15/29  brain]
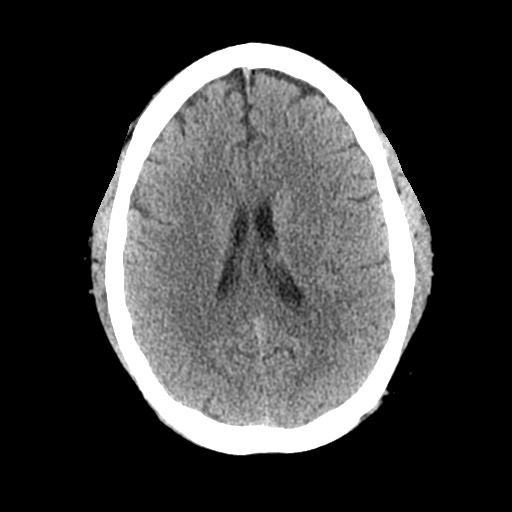
[im 18/29  brain]
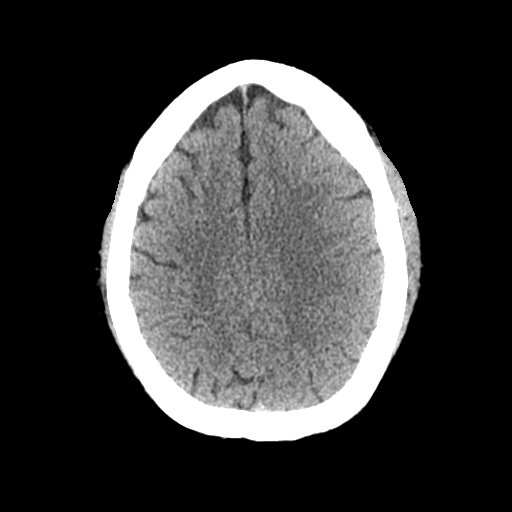
[im 18/29  bone]
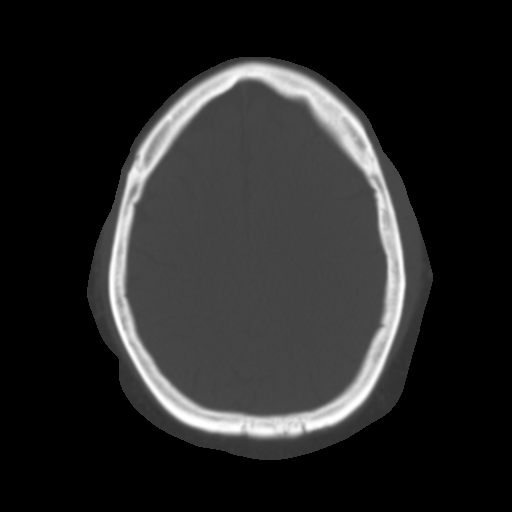
[im 22/29  brain]
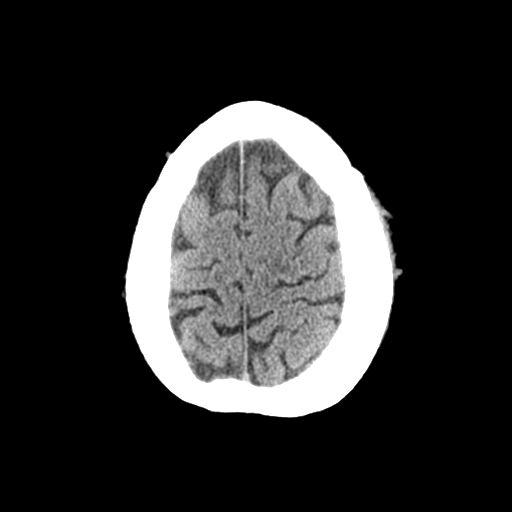
[im 25/29  brain]
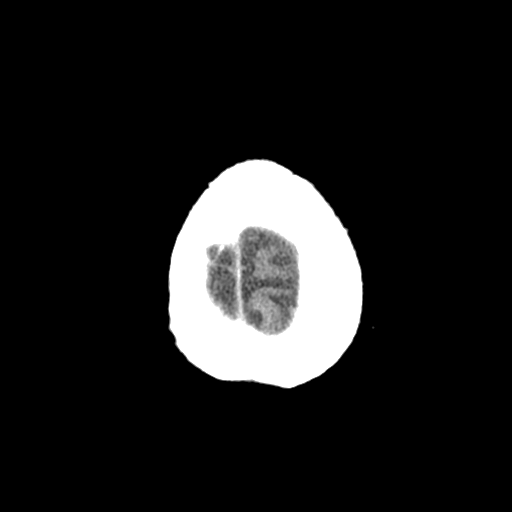

[Series 4: coronal soft tissue · coronal · 0.27mm/px · 3 of 63 slices shown]
[im 21/63  brain]
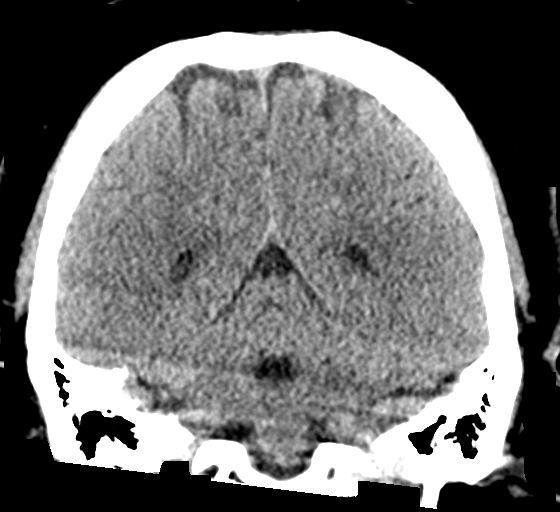
[im 28/63  brain]
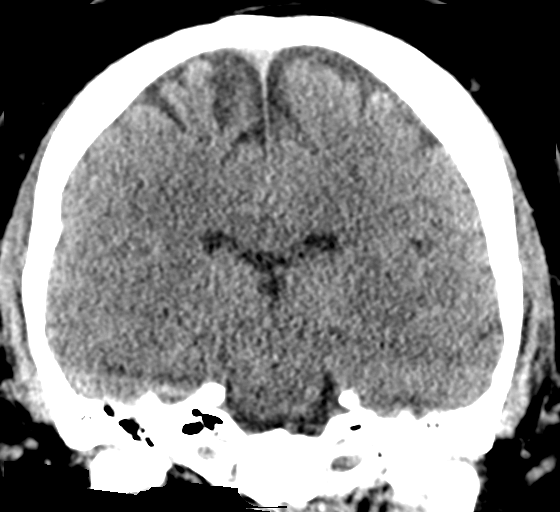
[im 35/63  brain]
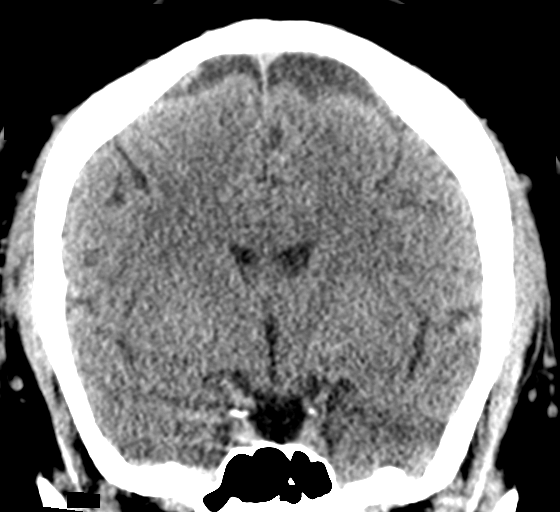

[Series 5: sagittal soft tissue · sagittal · 0.27mm/px · 3 of 51 slices shown]
[im 17/51  brain]
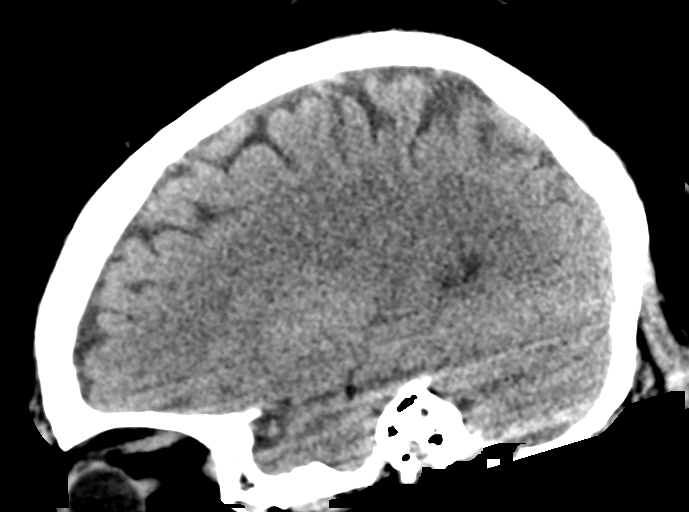
[im 26/51  brain]
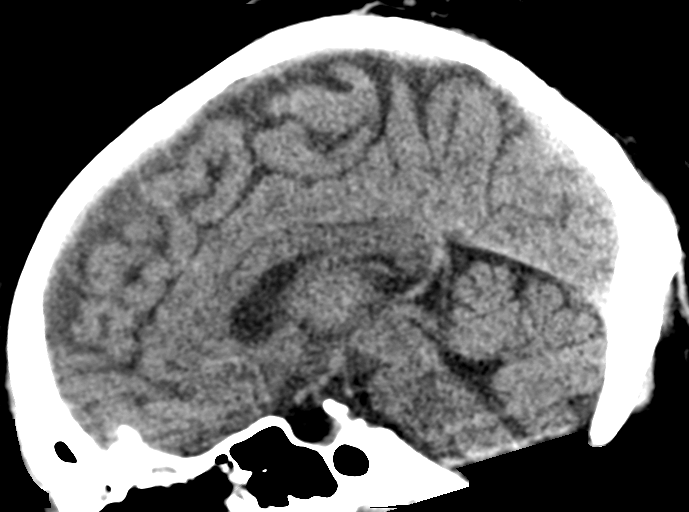
[im 34/51  brain]
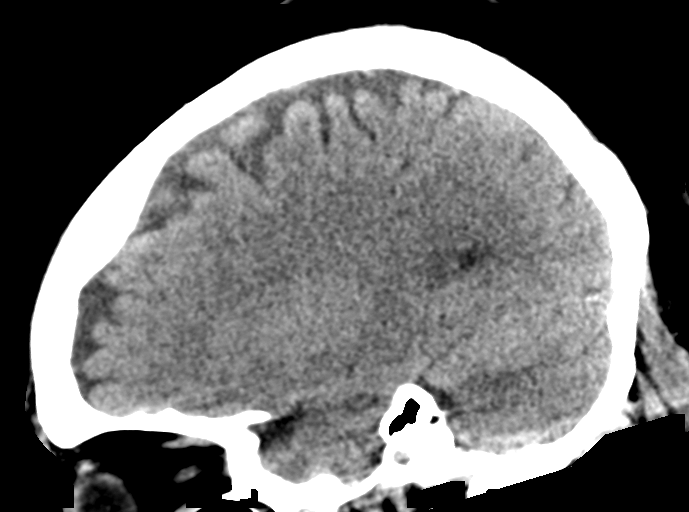

[16 of 47 positions shown; findings below may reference images not displayed]

FINDINGS: Brain: No acute intracranial abnormality. Specifically, no
hemorrhage, hydrocephalus, mass lesion, acute infarction, or
significant intracranial injury.

Vascular: No hyperdense vessel or unexpected calcification.

Skull: No acute calvarial abnormality.

Sinuses/Orbits: Visualized paranasal sinuses and mastoids clear.
Orbital soft tissues unremarkable.

Other: None
IMPRESSION: Normal study.

## 2020-07-10 NOTE — Unmapped (Signed)
PA for Altamease Oiler was approved.      Patient notified via MyChart message.

## 2020-07-10 NOTE — Unmapped (Signed)
PA initiated for Taltz via CMM.  Tyler Bradshaw??- PA Case ID: [id number] - Rx #: [id number]

## 2020-09-03 ENCOUNTER — Ambulatory Visit (INDEPENDENT_AMBULATORY_CARE_PROVIDER_SITE_OTHER)
Admission: EM | Admit: 2020-09-03 | Discharge: 2020-09-03 | Disposition: A | Discharge status: Home / Self Care | Payer: Federal, State, Local not specified - PPO | Source: Home / Self Care

## 2020-09-03 ENCOUNTER — Encounter: Payer: Self-pay | Admitting: Emergency Medicine

## 2020-09-03 ENCOUNTER — Other Ambulatory Visit: Payer: Self-pay

## 2020-09-03 ENCOUNTER — Emergency Department: Payer: Federal, State, Local not specified - PPO

## 2020-09-03 ENCOUNTER — Emergency Department
Admission: EM | Admit: 2020-09-03 | Discharge: 2020-09-03 | Disposition: A | Discharge status: Home / Self Care | Payer: Federal, State, Local not specified - PPO | Attending: Emergency Medicine | Admitting: Emergency Medicine

## 2020-09-03 DIAGNOSIS — R0602 Shortness of breath: Secondary | ICD-10-CM | POA: Diagnosis not present

## 2020-09-03 DIAGNOSIS — F10139 Alcohol abuse with withdrawal, unspecified: Secondary | ICD-10-CM | POA: Diagnosis present

## 2020-09-03 DIAGNOSIS — R110 Nausea: Secondary | ICD-10-CM | POA: Diagnosis not present

## 2020-09-03 DIAGNOSIS — F10930 Alcohol use, unspecified with withdrawal, uncomplicated: Secondary | ICD-10-CM

## 2020-09-03 DIAGNOSIS — F10230 Alcohol dependence with withdrawal, uncomplicated: Secondary | ICD-10-CM | POA: Insufficient documentation

## 2020-09-03 DIAGNOSIS — R0789 Other chest pain: Secondary | ICD-10-CM | POA: Diagnosis not present

## 2020-09-03 DIAGNOSIS — R5381 Other malaise: Secondary | ICD-10-CM | POA: Insufficient documentation

## 2020-09-03 DIAGNOSIS — R079 Chest pain, unspecified: Secondary | ICD-10-CM

## 2020-09-03 DIAGNOSIS — R000 Tachycardia, unspecified: Secondary | ICD-10-CM | POA: Insufficient documentation

## 2020-09-03 DIAGNOSIS — K2900 Acute gastritis without bleeding: Secondary | ICD-10-CM | POA: Insufficient documentation

## 2020-09-03 DIAGNOSIS — R251 Tremor, unspecified: Secondary | ICD-10-CM | POA: Diagnosis not present

## 2020-09-03 HISTORY — DX: Alcohol abuse, uncomplicated: F10.10

## 2020-09-03 LAB — COMPREHENSIVE METABOLIC PANEL
ALT: 32 U/L (ref 0–44)
AST: 69 U/L — ABNORMAL HIGH (ref 15–41)
Albumin: 4.1 g/dL (ref 3.5–5.0)
Alkaline Phosphatase: 85 U/L (ref 38–126)
Anion gap: 17 — ABNORMAL HIGH (ref 5–15)
BUN: 8 mg/dL (ref 6–20)
CO2: 25 mmol/L (ref 22–32)
Calcium: 9.5 mg/dL (ref 8.9–10.3)
Chloride: 96 mmol/L — ABNORMAL LOW (ref 98–111)
Creatinine, Ser: 0.75 mg/dL (ref 0.61–1.24)
GFR, Estimated: >60 mL/min (ref >60)
Glucose, Bld: 119 mg/dL — ABNORMAL HIGH (ref 70–99)
Potassium: 3.5 mmol/L (ref 3.5–5.1)
Sodium: 138 mmol/L (ref 135–145)
Total Bilirubin: 0.8 mg/dL (ref 0.3–1.2)
Total Protein: 8.6 g/dL — ABNORMAL HIGH (ref 6.5–8.1)

## 2020-09-03 LAB — CBC
HCT: 38.5 % — ABNORMAL LOW (ref 39.0–52.0)
Hemoglobin: 13 g/dL (ref 13.0–17.0)
MCH: 29.1 pg (ref 26.0–34.0)
MCHC: 33.8 g/dL (ref 30.0–36.0)
MCV: 86.1 fL (ref 80.0–100.0)
Platelets: 264 10*3/uL (ref 150–400)
RBC: 4.47 MIL/uL (ref 4.22–5.81)
RDW: 15.9 % — ABNORMAL HIGH (ref 11.5–15.5)
WBC: 5.6 10*3/uL (ref 4.0–10.5)
nRBC: 0 % (ref 0.0–0.2)

## 2020-09-03 LAB — BRAIN NATRIURETIC PEPTIDE: B Natriuretic Peptide: 5.4 pg/mL (ref 0.0–100.0)

## 2020-09-03 LAB — TROPONIN I (HIGH SENSITIVITY): Troponin I (High Sensitivity): 12 ng/L (ref <18)

## 2020-09-03 LAB — MAGNESIUM: Magnesium: 1.7 mg/dL (ref 1.7–2.4)

## 2020-09-03 LAB — ETHANOL: Alcohol, Ethyl (B): <10 mg/dL (ref <10)

## 2020-09-03 MED ORDER — CHLORDIAZEPOXIDE HCL 25 MG PO CAPS
50.0000 mg | ORAL_CAPSULE | Freq: Once | ORAL | Status: AC
  Administered 2020-09-03: 50 mg via ORAL
  Filled 2020-09-03: qty 2

## 2020-09-03 MED ORDER — LACTATED RINGERS IV BOLUS
500.0000 mL | Freq: Once | INTRAVENOUS | Status: AC
  Administered 2020-09-03: 500 mL via INTRAVENOUS

## 2020-09-03 MED ORDER — CHLORDIAZEPOXIDE HCL 25 MG PO CAPS: ORAL_CAPSULE | ORAL | 0 refills | Status: AC

## 2020-09-03 MED ORDER — LIDOCAINE VISCOUS HCL 2 % MT SOLN
15.0000 mL | Freq: Once | OROMUCOSAL | Status: AC
  Administered 2020-09-03: 15 mL via ORAL
  Filled 2020-09-03: qty 15

## 2020-09-03 MED ORDER — LORAZEPAM 2 MG/ML IJ SOLN
1.0000 mg | Freq: Once | INTRAMUSCULAR | Status: AC
  Administered 2020-09-03: 1 mg via INTRAVENOUS
  Filled 2020-09-03: qty 1

## 2020-09-03 MED ORDER — ALUM & MAG HYDROXIDE-SIMETH 200-200-20 MG/5ML PO SUSP
30.0000 mL | Freq: Once | ORAL | Status: AC
  Administered 2020-09-03: 30 mL via ORAL
  Filled 2020-09-03: qty 30

## 2020-09-03 MED ORDER — SUCRALFATE 1 G PO TABS: 1.0000 g | ORAL_TABLET | Freq: Four times a day (QID) | ORAL | 0 refills | Status: AC

## 2020-09-03 MED ORDER — LORAZEPAM 2 MG/ML IJ SOLN
2.0000 mg | Freq: Once | INTRAMUSCULAR | Status: AC
  Administered 2020-09-03: 2 mg via INTRAVENOUS
  Filled 2020-09-03: qty 1

## 2020-09-03 NOTE — ED Triage Notes (Signed)
Pt presents today with c/o of "indigestion and pain to bilateral lower extremities" x 1.5 days. He reports that is detoxing from alcohol x 1.5 days. He reports drinking one gallon of Vodka a day, last on 09/01/20. Complains of hand tremors. Denies n/v/d.

## 2020-09-03 NOTE — ED Triage Notes (Signed)
Pt comes pov with sob, shaking, chest pain, feeling lightheaded. Pt is a heavy drinker and stopped 1.5 days ago. States about a handle of liquor every day or so. Pt also states new feet and ankle swelling.

## 2020-09-03 NOTE — Discharge Instructions (Addendum)
Please go to the nearest emergency department.  As discussed with you do not manage alcohol withdrawal in the urgent care.  You need a case manager, medications to help your symptoms as well as fluids and labs drawn.  You have been advised to follow up immediately in the emergency department for concerning signs.symptoms. If you declined EMS transport, please have a family member take you directly to the ED at this time. Do not delay. Based on concerns about condition, if you do not follow up in th e ED, you may risk poor outcomes including worsening of condition, delayed treatment and potentially life threatening issues. If you have declined to go to the ED at this time, you should call your PCP immediately to set up a follow up appointment.  Go to ED for red flag symptoms, including; fevers you cannot reduce with Tylenol/Motrin, severe headaches, vision changes, numbness/weakness in part of the body, lethargy, confusion, intractable vomiting, severe dehydration, chest pain, breathing difficulty, severe persistent abdominal or pelvic pain, signs of severe infection (increased redness, swelling of an area), feeling faint or passing out, dizziness, etc. You should especially go to the ED for sudden acute worsening of condition if you do not elect to go at this time.

## 2020-09-03 NOTE — ED Provider Notes (Signed)
[facility] Emergency Department Provider Note   ____________________________________________   Event Date/Time   First MD Initiated Contact with Patient 09/03/20 1401     (approximate)  I have reviewed the triage vital signs and the nursing notes.   HISTORY  Chief Complaint detox    HPI Hunter Holmes is a 35 y.o. male with past medical history of anxiety and alcohol abuse who presents to the ED complaining of alcohol withdrawal.  Patient states that he has been drinking up to a gallon of liquor on a daily basis "off and on" for the past couple of years.  He states his last drink was about 48 hours ago and he has been feeling increasingly shaky since then.  He has felt malaised with nausea and occasional pressure in his chest.  He denies any abdominal pain, vomiting, diarrhea, fever, cough, or shortness of breath.  He reports symptoms similar to when he has attempted to withdraw from alcohol in the past.  He denies any history of seizures or DTs.  He was given 2 mg of Ativan in triage, reports symptoms are improved but he continues to feel shaky.        Past Medical History:  Diagnosis Date   Alcohol abuse    Anxiety     Patient Active Problem List   Diagnosis Date Noted   Syphilis 08/03/2019   Alcohol intoxication (HCC) 02/22/2019   Elevated CK 02/22/2019   Anxiety     Past Surgical History:  Procedure Laterality Date   BARIATRIC SURGERY  2010   gastric bypass    GASTRIC BYPASS  2009   WRIST SURGERY      Prior to Admission medications   Medication Sig Start Date End Date Taking? Authorizing Provider  acetaminophen (TYLENOL) 500 MG tablet Take 500-1,000 mg by mouth every 6 (six) hours as needed for mild pain or fever.    [provider]  ALPRAZolam Prudy Feeler) 1 MG tablet Take 1 mg by mouth daily as needed. 08/19/20   [provider]  chlordiazePOXIDE (LIBRIUM) 25 MG capsule Take 1 capsule (25 mg total) by mouth 3 (three) times  daily as needed for anxiety. Take 3x daily for two days, then 2x daily x 2 days, then 1 daily for two days 02/22/19   Willy Eddy, MD  folic acid (FOLVITE) 1 MG tablet Take 1 mg by mouth daily. 01/18/19   [provider]  furosemide (LASIX) 40 MG tablet Take 40 mg by mouth daily as needed for fluid or edema.     [provider]  hydrochlorothiazide (HYDRODIURIL) 50 MG tablet Take 50 mg by mouth daily. 01/13/19   [provider]  LORazepam (ATIVAN) 1 MG tablet Take 1 mg by mouth 2 (two) times daily as needed for anxiety. 01/09/19   [provider]  losartan (COZAAR) 50 MG tablet Take 50 mg by mouth daily. 01/09/19   [provider]  QUEtiapine (SEROQUEL) 50 MG tablet Take 50 mg by mouth at bedtime. 12/14/18   [provider]  TALTZ 80 MG/ML SOAJ Inject 80 mg into the skin every 28 (twenty-eight) days. 02/05/19   [provider]  VIIBRYD 20 MG TABS Take 20 mg by mouth daily. 01/13/19   [provider]    Allergies Patient has no known allergies.  Family History  Problem Relation Age of Onset   Obesity Mother    Hypertension Mother    Other Father        unknown  medical history    Social History Social History   Tobacco Use   Smoking status: Never   Smokeless tobacco: Never  Vaping Use   Vaping Use: Never used  Substance Use Topics   Alcohol use: Yes    Comment:  binge drink- have drank 1 gallon in 24 hour period on many occasions    Drug use: Never    Review of Systems  Constitutional: No fever/chills.  Positive for generalized weakness and malaise. Eyes: No visual changes. ENT: No sore throat. Cardiovascular: Positive for chest pain. Respiratory: Denies shortness of breath. Gastrointestinal: No abdominal pain.  Positive for nausea, no vomiting.  No diarrhea.  No constipation. Genitourinary: Negative for dysuria. Musculoskeletal: Negative for back pain. Skin: Negative for rash. Neurological: Negative for  headaches, focal weakness or numbness.  Positive for tremors.  ____________________________________________   PHYSICAL EXAM:  VITAL SIGNS: ED Triage Vitals [09/03/20 1145]  Enc Vitals Group     BP (!) 155/101     Pulse Rate (!) 116     Resp (!) 25     Temp 98.1 F (36.7 C)     Temp Source Oral     SpO2 98 %     Weight (!) 450 lb (204.1 kg)     Height 5\' 9"  (1.753 m)     Head Circumference      Peak Flow      Pain Score 9     Pain Loc      Pain Edu?      Excl. in GC?     Constitutional: Alert and oriented. Eyes: Conjunctivae are normal. Head: Atraumatic. Nose: No congestion/rhinnorhea. Mouth/Throat: Mucous membranes are moist. Neck: Normal ROM Cardiovascular: Tachycardic, regular rhythm. Grossly normal heart sounds.  2+ radial pulses bilaterally. Respiratory: Normal respiratory effort.  No retractions. Lungs CTAB. Gastrointestinal: Soft and nontender. No distention. Genitourinary: deferred Musculoskeletal: No lower extremity tenderness nor edema. Neurologic:  Normal speech and language. No gross focal neurologic deficits are appreciated. Skin:  Skin is warm, dry and intact. No rash noted. Psychiatric: Mood and affect are normal. Speech and behavior are normal.  ____________________________________________   LABS (all labs ordered are listed, but only abnormal results are displayed)  Labs Reviewed  CBC - Abnormal; Notable for the following components:      Result Value   HCT 38.5 (*)    RDW 15.9 (*)    All other components within normal limits  COMPREHENSIVE METABOLIC PANEL - Abnormal; Notable for the following components:   Chloride 96 (*)    Glucose, Bld 119 (*)    Total Protein 8.6 (*)    AST 69 (*)    Anion gap 17 (*)    All other components within normal limits  BRAIN NATRIURETIC PEPTIDE  ETHANOL  MAGNESIUM  TROPONIN I (HIGH SENSITIVITY)   ____________________________________________  EKG  ED ECG REPORT I, , the attending  physician, personally viewed and interpreted this ECG.   Date: 09/03/2020  EKG Time: 11:43  Rate: 110  Rhythm: sinus tachycardia  Axis: Normal  Intervals:none  ST&T Change: None   PROCEDURES  Procedure(s) performed (including Critical Care):  Procedures   ____________________________________________   INITIAL IMPRESSION / ASSESSMENT AND PLAN / ED COURSE      35 year old male with past medical history of anxiety and alcohol abuse who presents to the ED for chest pressure, nausea, and tremors after quitting drinking approximately 2 days ago.  He was noted to be tachycardic and hypertensive in triage, subsequently  received 2 mg of Ativan and now feels slightly better but has ongoing symptoms.  He had complained of chest pressure, EKG shows no evidence of arrhythmia or ischemia and troponin is negative, doubt cardiac etiology for his symptoms.  Labs are unremarkable, LFTs and lipase reassuring.  We will add on magnesium and chest x-ray, hydrate with IV fluids, and manage his alcohol withdrawal with additional IV Ativan along with oral dose of Librium.  Chest x-ray reviewed by me and shows no infiltrate, edema, or effusion.  Patient turned over to oncoming provider pending reassessment following additional benzodiazepines for alcohol withdrawal.  If his vital signs stabilized, he would be appropriate for discharge home with Librium taper and referral for outpatient detox.      ____________________________________________   FINAL CLINICAL IMPRESSION(S) / ED DIAGNOSES  Final diagnoses:  Alcohol withdrawal syndrome without complication (HCC)  Chest pain, unspecified type     ED Discharge Orders     None        Note:  This document was prepared using Dragon voice recognition software and may include unintentional dictation errors.    Chesley Noon, MD 09/03/20 [phone]

## 2020-09-03 NOTE — ED Provider Notes (Signed)
[facility]    CSN: [id number] Arrival date & time: 09/03/20  0917      History   Chief Complaint Chief Complaint  Patient presents with   Gastroesophageal Reflux   Leg Pain    HPI Hunter Holmes is a 35 y.o. male with numerous complaints.  Patient has a history of alcohol abuse over the past 4 years.  Patient states he is tried to quit multiple times but has not been successful.  He says that he has not had any alcohol in the past 1.5 days.  Patient reports regularly drinking "a gallon of vodka a day."  Patient states that he currently feels short of breath and has upper abdominal pain radiating into his chest with associated heartburn.  Patient reports history of GERD as well.  He says that he has not had anything to eat in the past couple of days.  He denies any nausea, vomiting, constipation or diarrhea.  He also admits to significant pain in his legs as well as swelling of his lower extremities and states that his hands been shaking."  He denies any fatigue or weakness.  Patient says he has had symptoms like this when he is trying to quit drinking before and had to be seen in the emergency department.  Patient says he knows the only thing that will help him right now as if he drinks but he does not want to do that.  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  He has no other complaints.  HPI  Past Medical History:  Diagnosis Date   Alcohol abuse    Anxiety     Patient Active Problem List   Diagnosis Date Noted   Syphilis 08/03/2019   Alcohol intoxication (HCC) 02/22/2019   Elevated CK 02/22/2019   Anxiety     Past Surgical History:  Procedure Laterality Date   BARIATRIC SURGERY  2010   gastric bypass    GASTRIC BYPASS  2009   WRIST SURGERY         Home Medications    Prior to Admission medications   Medication Sig Start Date End Date Taking?  Authorizing Provider  ALPRAZolam Prudy Feeler(XANAX) 1 MG tablet Take 1 mg by mouth daily as needed. 08/19/20  Yes [provider]  furosemide (LASIX) 40 MG tablet Take 40 mg by mouth daily as needed for fluid or edema.    Yes [provider]  acetaminophen (TYLENOL) 500 MG tablet Take 500-1,000 mg by mouth every 6 (six) hours as needed for mild pain or fever.    [provider]  chlordiazePOXIDE (LIBRIUM) 25 MG capsule Take 1 capsule (25 mg total) by mouth 3 (three) times daily as needed for anxiety. Take 3x daily for two days, then 2x daily x 2 days, then 1 daily for two days 02/22/19   Willy Eddyobinson, Patrick, MD  folic acid (FOLVITE) 1 MG tablet Take 1 mg by mouth daily. 01/18/19   [provider]  hydrochlorothiazide (HYDRODIURIL) 50 MG tablet Take 50 mg by mouth daily. 01/13/19   [provider]  LORazepam (ATIVAN) 1 MG tablet Take 1 mg by mouth 2 (two) times daily as needed for anxiety. 01/09/19   [provider]  losartan (COZAAR) 50 MG tablet Take 50 mg by mouth daily. 01/09/19   [provider]  QUEtiapine (SEROQUEL) 50 MG tablet Take 50 mg by mouth at  bedtime. 12/14/18   [provider]  TALTZ 80 MG/ML SOAJ Inject 80 mg into the skin every 28 (twenty-eight) days. 02/05/19   [provider]  VIIBRYD 20 MG TABS Take 20 mg by mouth daily. 01/13/19   [provider]    Family History Family History  Problem Relation Age of Onset   Obesity Mother    Hypertension Mother    Other Father        unknown medical history    Social History Social History   Tobacco Use   Smoking status: Never   Smokeless tobacco: Never  Vaping Use   Vaping Use: Never used  Substance Use Topics   Alcohol use: Yes    Comment:  binge drink- have drank 1 gallon in 24 hour period on many occasions    Drug use: Never     Allergies   Patient has no known allergies.   Review of Systems Review of Systems  Constitutional:  Positive for  appetite change and fatigue. Negative for fever.  Respiratory:  Positive for shortness of breath. Negative for wheezing.   Cardiovascular:  Positive for chest pain and leg swelling. Negative for palpitations.  Gastrointestinal:  Positive for abdominal pain. Negative for constipation, diarrhea, nausea and vomiting.  Musculoskeletal:  Positive for arthralgias and myalgias.  Neurological:  Positive for tremors. Negative for dizziness, weakness and numbness.    Physical Exam Triage Vital Signs ED Triage Vitals  Enc Vitals Group     BP 09/03/20 0938 (!) 160/102     Pulse Rate 09/03/20 0938 (!) 114     Resp 09/03/20 0938 (!) 22     Temp 09/03/20 0938 98.4 F (36.9 C)     Temp Source 09/03/20 0938 Oral     SpO2 09/03/20 0938 97 %     Weight 09/03/20 0935 (!) 450 lb (204.1 kg)     Height --      Head Circumference --      Peak Flow --      Pain Score 09/03/20 0935 10     Pain Loc --      Pain Edu? --      Excl. in GC? --    No data found.  Updated Vital Signs BP (!) 160/102 (BP Location: Left Arm) Comment: hasn't taken BP meds today, last yesterday  Pulse (!) 114   Temp 98.4 F (36.9 C) (Oral)   Resp (!) 22   Wt (!) 450 lb (204.1 kg)   SpO2 97%   BMI 66.45 kg/m       Physical Exam Vitals and nursing note reviewed.  Constitutional:      General: He is not in acute distress.    Appearance: Normal appearance. He is well-developed. He is obese. He is not ill-appearing.  HENT:     Head: Normocephalic and atraumatic.  Eyes:     General: No scleral icterus.    Conjunctiva/sclera: Conjunctivae normal.  Cardiovascular:     Rate and Rhythm: Regular rhythm. Tachycardia present.     Heart sounds: Normal heart sounds.  Pulmonary:     Effort: Pulmonary effort is normal. No respiratory distress.     Breath sounds: Normal breath sounds.  Abdominal:     Palpations: Abdomen is soft.     Tenderness: There is abdominal tenderness (TTP epigastric and LUQ).  Musculoskeletal:      Cervical back: Neck supple.     Right lower leg: Edema present.     Left lower leg:  Edema present.  Skin:    General: Skin is warm and dry.  Neurological:     General: No focal deficit present.     Mental Status: He is alert. Mental status is at baseline.     Motor: No weakness.     Gait: Gait normal.     Comments: Mild shaking tremors of hands  Psychiatric:        Mood and Affect: Mood normal.        Thought Content: Thought content normal.     UC Treatments / Results  Labs (all labs ordered are listed, but only abnormal results are displayed) Labs Reviewed - No data to display  EKG   Radiology No results found.  Procedures Procedures (including critical care time)  Medications Ordered in UC Medications - No data to display  Initial Impression / Assessment and Plan / UC Course  I have reviewed the triage vital signs and the nursing notes.  Pertinent labs & imaging results that were available during my care of the patient were reviewed by me and considered in my medical decision making (see chart for details).  35 year old male with history of alcohol abuse presenting for withdrawal symptoms.  Patient reports that he has not had any alcohol in the past 1.5 days and normally drinks "a gallon of vodka a day."  Patient currently experiencing upper abdominal pain with radiation to the chest with associated shortness of breath and decreased appetite.  Also experiencing leg cramping and swelling as well as tremors of hands.  BP elevated at 160/102.  Pulse elevated at 114.  He is in no distress but does appear uncomfortable.  Exam is significant for tachycardia.  Chest is clear to auscultation and heart regular rate.  TTP of left upper quadrant and epigastric region.  I have advised patient that since he is going through alcohol withdrawal symptoms, this needs to be done in a monitored environment.  We do not manage alcohol withdrawal in urgent care setting.  I advised him to go  to the emergency department at this time where he can have labs checked, fluids and get set up with a case manager to help him get into a program that he can detox safely.  Patient agrees and reports that he will go to [facility] at this time.   Final Clinical Impressions(s) / UC Diagnoses   Final diagnoses:  Alcohol withdrawal syndrome without complication (HCC)  Acute gastritis without hemorrhage, unspecified gastritis type     Discharge Instructions      Please go to the nearest emergency department.  As discussed with you do not manage alcohol withdrawal in the urgent care.  You need a case manager, medications to help your symptoms as well as fluids and labs drawn.  You have been advised to follow up immediately in the emergency department for concerning signs.symptoms. If you declined EMS transport, please have a family member take you directly to the ED at this time. Do not delay. Based on concerns about condition, if you do not follow up in th e ED, you may risk poor outcomes including worsening of condition, delayed treatment and potentially life threatening issues. If you have declined to go to the ED at this time, you should call your PCP immediately to set up a follow up appointment.  Go to ED for red flag symptoms, including; fevers you cannot reduce with Tylenol/Motrin, severe headaches, vision changes, numbness/weakness in part of the body, lethargy, confusion, intractable vomiting, severe dehydration, chest  pain, breathing difficulty, severe persistent abdominal or pelvic pain, signs of severe infection (increased redness, swelling of an area), feeling faint or passing out, dizziness, etc. You should especially go to the ED for sudden acute worsening of condition if you do not elect to go at this time.      ED Prescriptions   None    PDMP not reviewed this encounter.   Shirlee Latch, PA-C 09/03/20 1013

## 2020-09-03 NOTE — ED Provider Notes (Signed)
Emergency Medicine Provider Triage Evaluation Note  Hunter Holmes , a 35 y.o. male  was evaluated in triage.  Pt complains of breath, shaking, and feeling lightheaded.  Patient is a daily alcohol drinker, who discontinued about a day and a half ago.  Patient drinks a "gallon of vodka" daily.  Initially presented to [facility], is referred to the ED for further evaluation of his alcohol withdrawal symptoms.  Review of Systems  Positive: Chest pain, SOB. BLE edema Negative: Vomiting, diarrhea  Physical Exam  BP (!) 155/101   Pulse (!) 116   Temp 98.1 F (36.7 C) (Oral)   Resp (!) 25   Ht 5\' 9"  (1.753 m)   Wt (!) 204.1 kg   SpO2 98%   BMI 66.45 kg/m  Gen:   Awake, no distress  afebrile Resp:  Normal effort normal breath sounds MSK:   Moves extremities without difficulty BLE edema Other:  RRR  Medical Decision Making  Medically screening exam initiated at 12:31 PM.  Appropriate orders placed.  Hunter Holmes was informed that the remainder of the evaluation will be completed by another provider, this initial triage assessment does not replace that evaluation, and the importance of remaining in the ED until their evaluation is complete.  Ativan 2 mg IVP  Patient with ED evaluation of alcohol withdrawal symptoms including shortness of breath chest pain lightheadedness.   Hunter Larsen, PA-C 09/03/20 1235    09/05/20, MD 09/06/20 [phone]

## 2020-09-03 NOTE — ED Notes (Signed)
URINE SENT IF NEEDED

## 2020-09-03 NOTE — ED Provider Notes (Signed)
-----------------------------------------   6:08 PM on 09/03/2020 ----------------------------------------- Patient appears well, heart rate improved with Ativan.  Discussed options with the patient and believe the most reasonable option would be discharged home with Librium and outpatient RTS follow-up.  I had a long discussion with the patient regarding Librium and avoiding alcohol use and to discontinue Librium if he is going to start drinking again.  Patient states he believes he has done drinking and would prefer discharge home with Librium.  Patient's medical work-up otherwise has been nonrevealing.  Was complaining of some mild heartburn.  Given a GI cocktail with some symptom improvement.  Troponin was negative.   Minna Antis, MD 09/03/20 [phone]

## 2020-09-29 ENCOUNTER — Ambulatory Visit: Payer: Federal, State, Local not specified - PPO

## 2020-09-30 ENCOUNTER — Ambulatory Visit: Payer: Federal, State, Local not specified - PPO

## 2020-11-18 NOTE — Unmapped (Signed)
Tyler Bradshaw had benefit changes causing his Altame[facility]iler script to change to a different pharmacy.  I called the pharmacy to transfer the script with the same expiration.  I also asked the call center to arrange his follow-up visit.

## 2020-12-09 DIAGNOSIS — L409 Psoriasis, unspecified: Principal | ICD-10-CM

## 2020-12-09 MED ORDER — TALTZ AUTOINJECTOR 80 MG/ML SUBCUTANEOUS
0 refills | 0.00000 days
Start: 2020-12-09 — End: ?

## 2020-12-10 NOTE — Unmapped (Signed)
LOV 12/20/19    Rx request pended for [employer]

## 2020-12-12 MED ORDER — TALTZ AUTOINJECTOR 80 MG/ML SUBCUTANEOUS
SUBCUTANEOUS | 11 refills | 0.00000 days | Status: CP
Start: 2020-12-12 — End: ?

## 2021-01-01 NOTE — Unmapped (Deleted)
Dermatology Note     Assessment and Plan:      Benign Lesions/ Findings:   {derm skin check benign A&P:75424}  - Reassurance provided regarding the benign appearance of lesions noted on exam today; no treatment is indicated in the absence of symptoms/changes.  - Reinforced importance of photoprotective strategies including liberal and frequent sunscreen use of a broad-spectrum SPF 30 or greater, use of protective clothing, and sun avoidance for prevention of cutaneous malignancy and photoaging.  Counseled patient on the importance of regular self-skin monitoring as well as routine clinical skin examinations as scheduled.     Personal history of {derm skin check cancer list:75440} ***  - No evidence of recurrence at this time.  - Discussed maintaining vigilance and counseled on sun protection as above.    There are no diagnoses linked to this encounter.    The patient was advised to call for an appointment should any new, changing, or symptomatic lesions develop.     RTC: No follow-ups on file. or sooner as needed   _________________________________________________________________      Chief Complaint     Chief Complaint   Patient presents with   ??? Psoriasis     Pt here for follow up of Psoriasis and refill on medication       HPI     Tyler Bradshaw is a 35 y.o. male who presents as a {Derm pt type:71410} [facility] for {reason for visit:75513} ***    The patient denies any other new or changing lesions or areas of concern.     Pertinent Past Medical History     {derm PMH :75456}    Problem List    None    ***    Family History:   {derm skin check melanoma history:75452}    Past Medical History, Family History, Social History, Medication List, Allergies, and Problem List were reviewed in the rooming section of Epic.     ROS: Other than symptoms mentioned in the HPI, {derm skin check ros:75408::no fevers, chills, or other skin complaints}    Physical Examination     GENERAL: Well-appearing male in no acute distress, resting comfortably.  {PE systems:75418::NEURO: Alert and oriented, answers questions appropriately}  {PE extent:75514}  {PE list:75421}  ***    {PE limitations:75419::All areas not commented on are within normal limits or unremarkable}      (Approved Template 09/24/2019)

## 2021-01-05 NOTE — Unmapped (Signed)
This encounter was created in error - please disregard.

## 2021-10-27 IMAGING — DX DG CHEST 1V PORT
1 series · 2 of 2 positions shown · non-contrast
Comparison: 02/22/2019

CLINICAL DATA: A call detox

EXAM:
PORTABLE CHEST 1 VIEW

[Series 1: chest ap · 0.14mm/px · 2 of 2 slices shown]
[im 1/2]
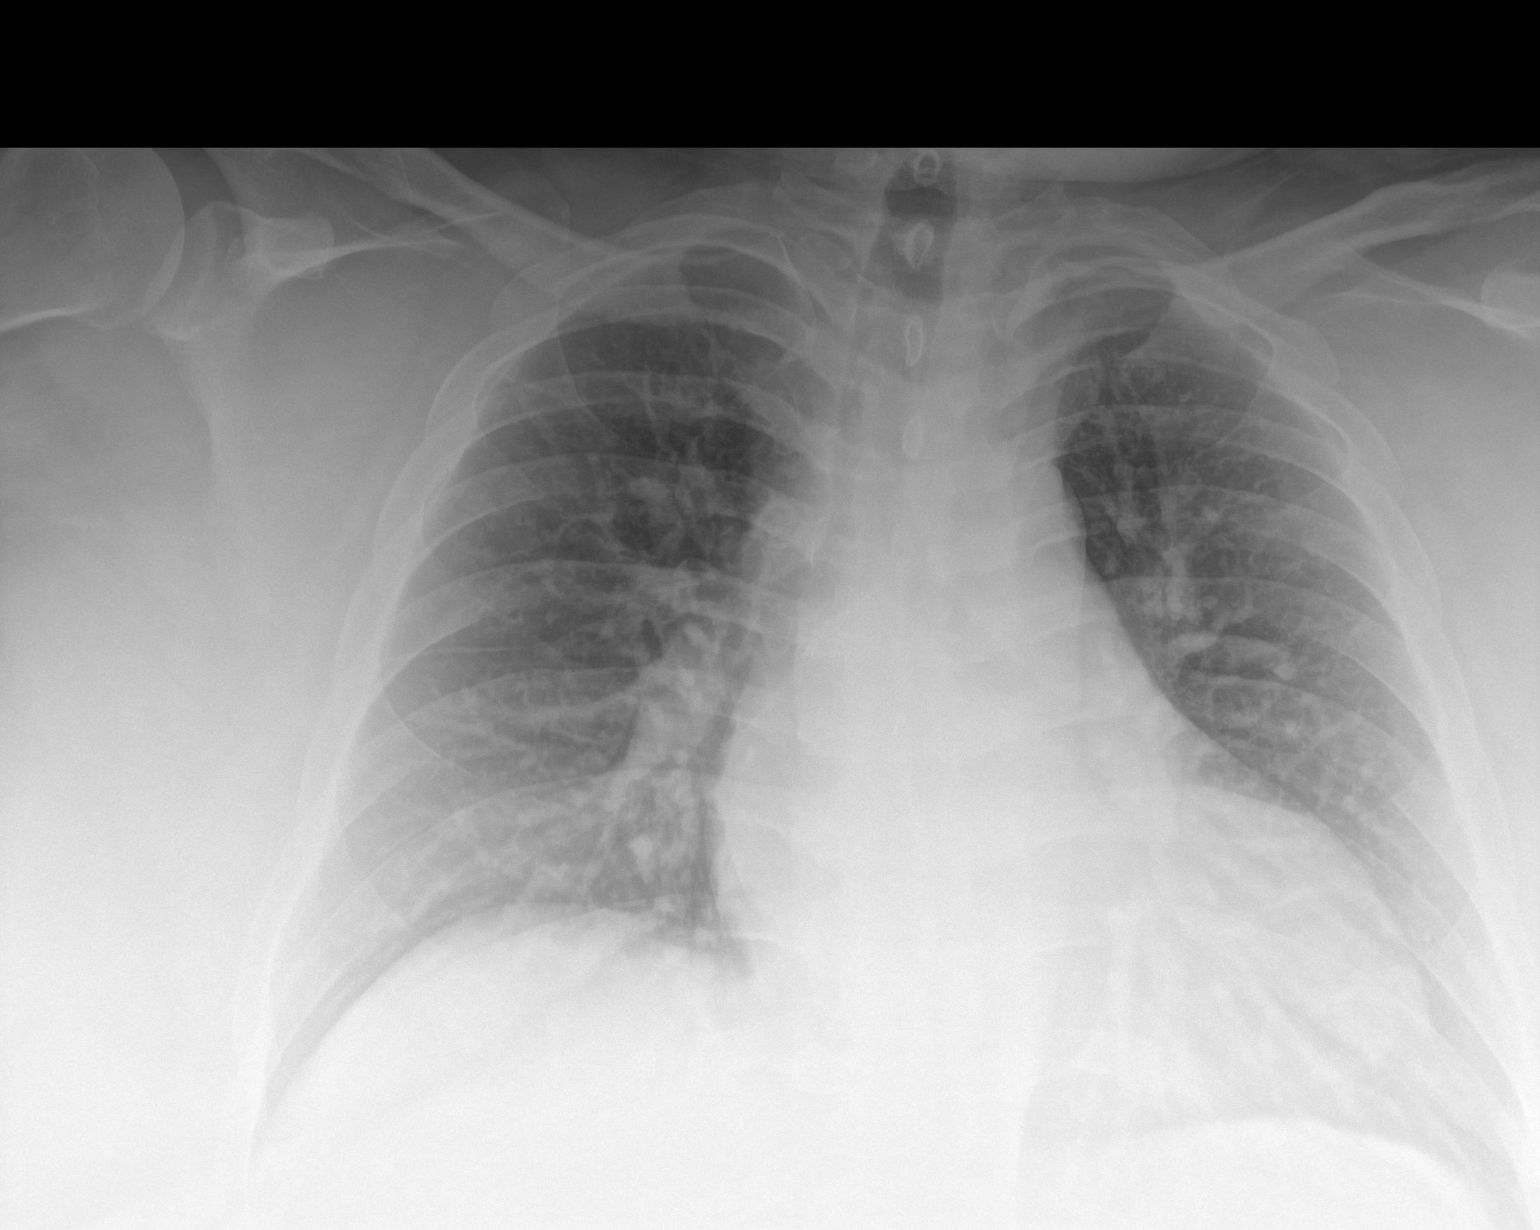
[im 2/2]
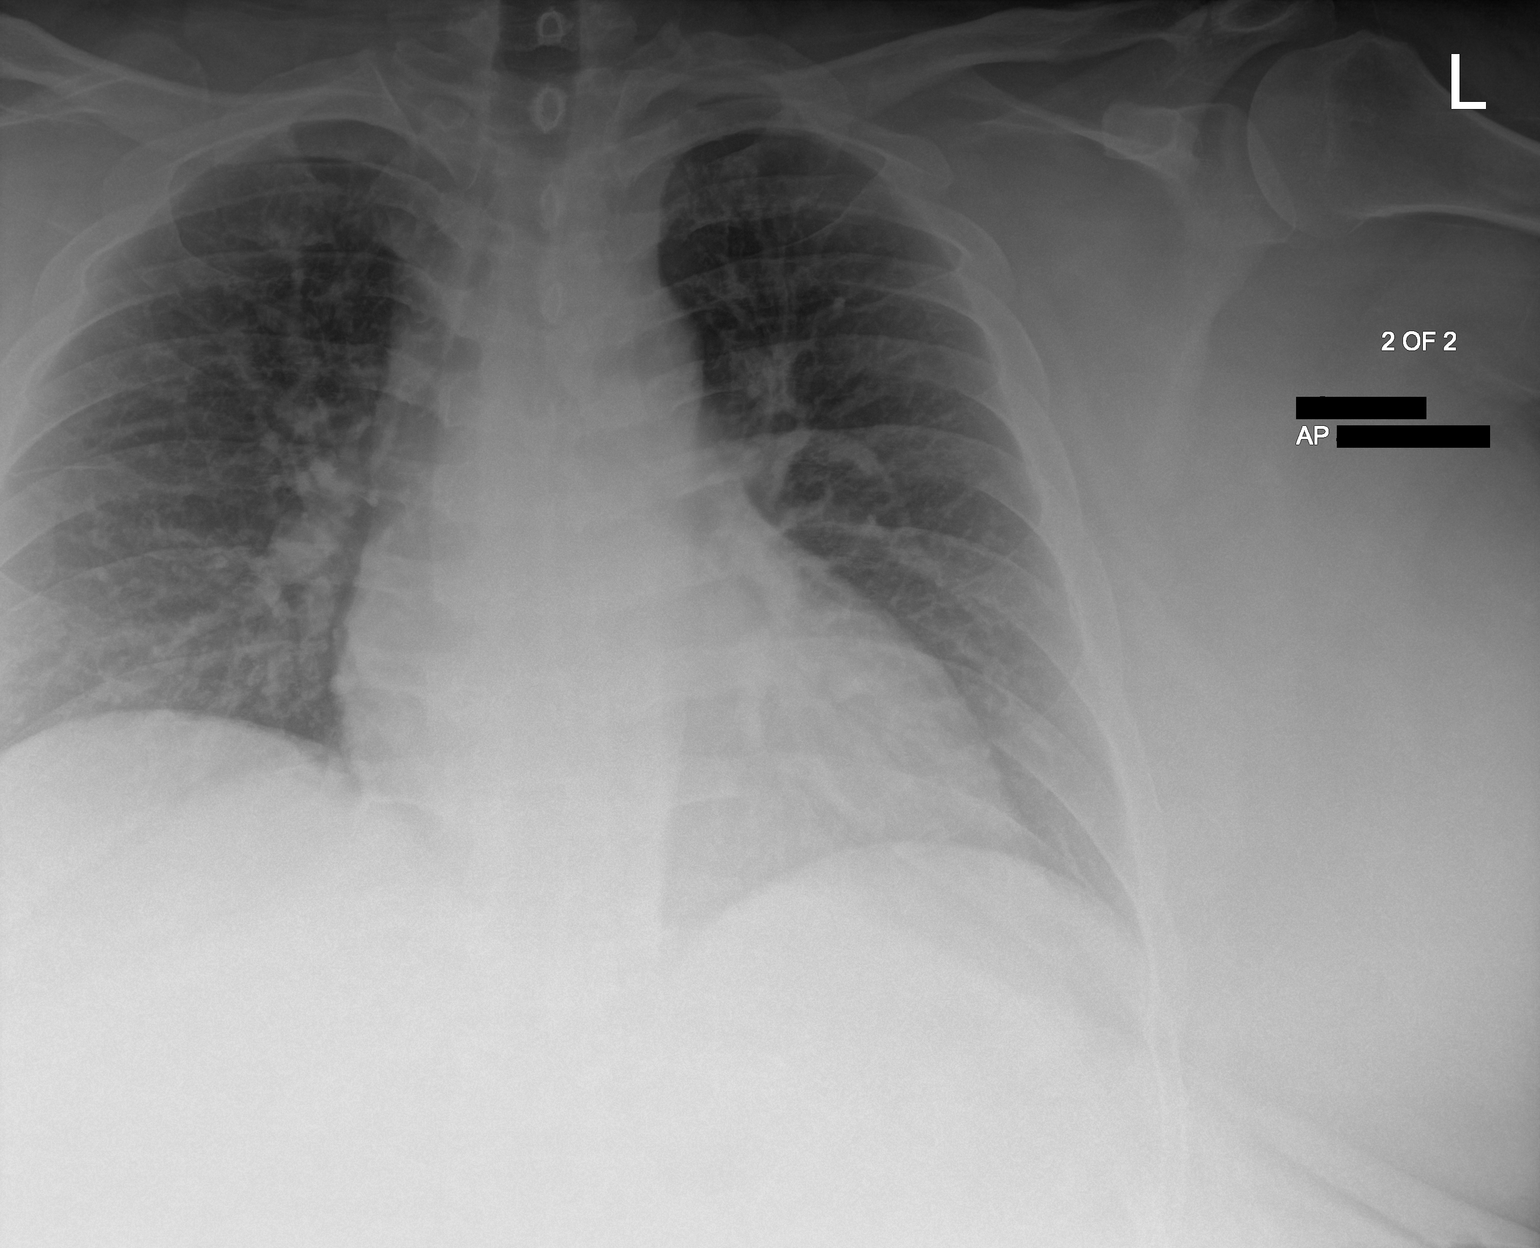

[2 of 2 positions shown; findings below may reference images not displayed]

FINDINGS: Cardiomegaly. Both lungs are clear. The visualized skeletal
structures are unremarkable.
IMPRESSION: Cardiomegaly without acute abnormality of the lungs in AP portable
projection.

## 2021-11-24 DIAGNOSIS — L409 Psoriasis, unspecified: Principal | ICD-10-CM

## 2021-11-24 MED ORDER — TALTZ AUTOINJECTOR 80 MG/ML SUBCUTANEOUS
11 refills | 0.00000 days
Start: 2021-11-24 — End: ?

## 2021-12-17 DIAGNOSIS — L409 Psoriasis, unspecified: Principal | ICD-10-CM

## 2021-12-17 MED ORDER — TALTZ AUTOINJECTOR 80 MG/ML SUBCUTANEOUS
11 refills | 0.00000 days
Start: 2021-12-17 — End: ?

## 2021-12-17 NOTE — Unmapped (Signed)
Appointment on 12/22    Thanks,  [place]

## 2022-02-03 ENCOUNTER — Ambulatory Visit
Admit: 2022-02-03 | Discharge: 2022-02-04 | Payer: PRIVATE HEALTH INSURANCE | Attending: Student in an Organized Health Care Education/Training Program | Primary: Student in an Organized Health Care Education/Training Program

## 2022-02-03 DIAGNOSIS — L400 Psoriasis vulgaris: Principal | ICD-10-CM

## 2022-02-03 DIAGNOSIS — Z79899 Other long term (current) drug therapy: Principal | ICD-10-CM

## 2022-02-03 MED ORDER — TALTZ AUTOINJECTOR 80 MG/ML SUBCUTANEOUS
SUBCUTANEOUS | 11 refills | 0.00000 days | Status: CP
Start: 2022-02-03 — End: ?

## 2022-02-03 MED ORDER — CLOBETASOL 0.05 % TOPICAL OINTMENT
Freq: Two times a day (BID) | TOPICAL | 2 refills | 0.00000 days | Status: CP
Start: 2022-02-03 — End: ?

## 2022-02-03 NOTE — Unmapped (Addendum)
For your psoriasis:  - Taltz injections every 4 weeks (28 days)  - Apply clobetasol ointment twice daily as needed for rough, stubborn flares on body. Okay to use for up to 2-3 weeks at a time.

## 2022-02-03 NOTE — Unmapped (Signed)
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   - Flaring off of medication for 2 months now, but otherwise reports taltz has worked significantly well.  - Continue ixekizumab (TALTZ AUTOINJECTOR) 80 mg/mL AtIn; SubQ: 1 80 mg every 4 weeks  - Start clobetasol (TEMOVATE) 0.05 % ointment; Apply topically two (2) times a day. Apply to affected areas on body     High risk medication use (taltz)  - Last quant gold neg 03/2018. Repeat today.  - Hep c/b, hiv negative on 03/2018    The patient was advised to call for an appointment should any new, changing, or symptomatic lesions develop.     RTC: Return in about 1 year (around 02/04/2023) for follow up of psoriasis. or sooner as needed   _________________________________________________________________      Chief Complaint     Chief Complaint   Patient presents with    Psoriasis       HPI     Tyler Bradshaw is a 37 y.o. male who presents as a returning patient (last seen by Dr. Carles Collet on 12/20/2019) to Dermatology for follow up of psoriasis. At last visit, patient was to continue taltz 160 mg on week 0, 80 mg on weeks 2, 4, 6, 8, 10, and 12, and then 80 mg every 4 weeks.    Psoriasis  - Requests refills of taltz today  - Reports a thick, light-colored, rough patch on the right lower leg  - Notes he last had an injection around 2 months ago    The patient denies any other new or changing lesions or areas of concern.     Pertinent Past Medical History     No history of skin cancer    Family History:   Negative for melanoma    Past Medical History, Family History, Social History, Medication List, Allergies, and Problem List were reviewed in the rooming section of Epic.     ROS: Other than symptoms mentioned in the HPI, no fevers, chills, or other skin complaints    Physical Examination     GENERAL: Well-appearing male in no acute distress, resting comfortably.  NEURO: Alert and oriented, answers questions appropriately  PSYCH: Normal mood and affect  SKIN (Focal Skin Exam): Per patient request, examination of bilateral lower legs, face, hands was performed  - Well-demarcated, scaly, erythematous plaque of bilateral lower legs, BSA 5% (limited by exam)    All areas not commented on are within normal limits or unremarkable    Scribe's Attestation: Lowry Bowl, PA-C obtained and performed the history, physical exam and medical decision making elements that were entered into the chart.  Signed by Cherlynn Kaiser, Scribe, on February 03, 2022 at 9:33 AM.    ----------------------------------------------------------------------------------------------------------------------  February 03, 2022 12:27 PM. Documentation assistance provided by the Scribe. I was present during the time the encounter was recorded. The information recorded by the Scribe was done at my direction and has been reviewed and validated by me.  ----------------------------------------------------------------------------------------------------------------------      (Approved Template 09/24/2019)

## 2022-02-06 LAB — QUANTIFERON TB GOLD PLUS
QUANTIFERON ANTIGEN 1 MINUS NIL: -0.12 [IU]/mL
QUANTIFERON ANTIGEN 2 MINUS NIL: -0.11 [IU]/mL
QUANTIFERON MITOGEN: 9.57 [IU]/mL
QUANTIFERON TB GOLD PLUS: NEGATIVE
QUANTIFERON TB NIL VALUE: 0.43 [IU]/mL

## 2022-02-06 LAB — TB NIL: TB NIL VALUE: 0.43

## 2022-02-06 LAB — TB AG1: TB AG1 VALUE: 0.31

## 2022-02-06 LAB — TB AG2: TB AG2 VALUE: 0.32

## 2022-02-06 LAB — TB MITOGEN: TB MITOGEN VALUE: 10

## 2022-02-08 NOTE — Unmapped (Signed)
Pt called clinic requesting PA for taltz 80mg , PA was initiated via [facility]

## 2022-02-26 NOTE — Unmapped (Signed)
Received PA Request for Taltz 80mg . PA initiated via [facility] (Key: [id number])  Taltz 80MG /ML auto-injectors.    Your Prior Authorization request has been approved.  Select commercial Patients may be eligible for financial assistance through [employer]. Select Patient Copay Assistance above and provide the Patient's email to connect your Patient to these services. Limitations may apply.  Message from plan: Your PA request has been approved. Additional information will be provided in the approval communication. (Message 1145). Authorization Expiration Date: August 07, 2023.

## 2022-03-05 NOTE — Unmapped (Signed)
Prior authorization approval letter for [employer] uploaded to media tab.

## 2022-04-26 ENCOUNTER — Other Ambulatory Visit (HOSPITAL_BASED_OUTPATIENT_CLINIC_OR_DEPARTMENT_OTHER): Payer: Self-pay

## 2022-04-26 MED ORDER — SEMAGLUTIDE-WEIGHT MANAGEMENT 0.25 MG/0.5ML ~~LOC~~ SOAJ
0.2500 mg | SUBCUTANEOUS | 0 refills | Status: AC
  Filled 2022-04-26: qty 2, 28d supply, fill #0

## 2022-04-27 ENCOUNTER — Other Ambulatory Visit (HOSPITAL_BASED_OUTPATIENT_CLINIC_OR_DEPARTMENT_OTHER): Payer: Self-pay

## 2022-05-07 ENCOUNTER — Other Ambulatory Visit (HOSPITAL_BASED_OUTPATIENT_CLINIC_OR_DEPARTMENT_OTHER): Payer: Self-pay

## 2022-05-07 ENCOUNTER — Other Ambulatory Visit (HOSPITAL_COMMUNITY): Payer: Self-pay

## 2022-05-07 MED ORDER — WEGOVY 1 MG/0.5ML ~~LOC~~ SOAJ
1.0000 mg | SUBCUTANEOUS | 0 refills | Status: DC
  Filled 2022-05-07 – 2022-05-11 (×2): qty 2, 28d supply, fill #0

## 2022-05-11 ENCOUNTER — Other Ambulatory Visit (HOSPITAL_COMMUNITY): Payer: Self-pay

## 2022-05-11 ENCOUNTER — Other Ambulatory Visit (HOSPITAL_BASED_OUTPATIENT_CLINIC_OR_DEPARTMENT_OTHER): Payer: Self-pay

## 2022-07-02 ENCOUNTER — Other Ambulatory Visit (HOSPITAL_BASED_OUTPATIENT_CLINIC_OR_DEPARTMENT_OTHER): Payer: Self-pay

## 2022-07-03 ENCOUNTER — Other Ambulatory Visit (HOSPITAL_BASED_OUTPATIENT_CLINIC_OR_DEPARTMENT_OTHER): Payer: Self-pay

## 2022-07-03 MED ORDER — WEGOVY 1 MG/0.5ML ~~LOC~~ SOAJ
1.0000 mg | SUBCUTANEOUS | 3 refills | Status: AC
  Filled 2022-07-03: qty 2, 28d supply, fill #0

## 2022-07-05 ENCOUNTER — Other Ambulatory Visit (HOSPITAL_BASED_OUTPATIENT_CLINIC_OR_DEPARTMENT_OTHER): Payer: Self-pay

## 2022-07-20 ENCOUNTER — Other Ambulatory Visit (HOSPITAL_COMMUNITY): Payer: Self-pay

## 2022-07-20 MED ORDER — WEGOVY 1.7 MG/0.75ML ~~LOC~~ SOAJ
1.7000 mg | SUBCUTANEOUS | 0 refills | Status: DC
  Filled 2022-07-20: qty 3, 28d supply, fill #0
  Filled 2022-07-27: qty 3, 30d supply, fill #0

## 2022-07-20 MED ORDER — WEGOVY 2.4 MG/0.75ML ~~LOC~~ SOAJ
2.4000 mg | SUBCUTANEOUS | 2 refills | Status: AC
  Filled 2022-07-20 – 2022-08-18 (×2): qty 3, 28d supply, fill #0
  Filled 2022-09-14: qty 3, 28d supply, fill #1

## 2022-07-27 ENCOUNTER — Other Ambulatory Visit (HOSPITAL_COMMUNITY): Payer: Self-pay

## 2022-07-27 ENCOUNTER — Other Ambulatory Visit (HOSPITAL_BASED_OUTPATIENT_CLINIC_OR_DEPARTMENT_OTHER): Payer: Self-pay

## 2022-08-18 ENCOUNTER — Other Ambulatory Visit (HOSPITAL_BASED_OUTPATIENT_CLINIC_OR_DEPARTMENT_OTHER): Payer: Self-pay

## 2022-08-19 ENCOUNTER — Other Ambulatory Visit (HOSPITAL_BASED_OUTPATIENT_CLINIC_OR_DEPARTMENT_OTHER): Payer: Self-pay

## 2022-09-11 ENCOUNTER — Other Ambulatory Visit (HOSPITAL_BASED_OUTPATIENT_CLINIC_OR_DEPARTMENT_OTHER): Payer: Self-pay

## 2022-09-14 ENCOUNTER — Other Ambulatory Visit (HOSPITAL_BASED_OUTPATIENT_CLINIC_OR_DEPARTMENT_OTHER): Payer: Self-pay

## 2022-09-14 MED ORDER — WEGOVY 1.7 MG/0.75ML ~~LOC~~ SOAJ
1.7000 mg | SUBCUTANEOUS | 0 refills | Status: AC
  Filled 2022-09-14: qty 3, 30d supply, fill #0

## 2022-09-28 ENCOUNTER — Other Ambulatory Visit (HOSPITAL_COMMUNITY): Payer: Self-pay

## 2022-09-28 MED ORDER — WEGOVY 2.4 MG/0.75ML ~~LOC~~ SOAJ
2.4000 mg | SUBCUTANEOUS | 5 refills | Status: AC
  Filled 2022-10-14: qty 3, 28d supply, fill #0

## 2022-10-14 ENCOUNTER — Other Ambulatory Visit (HOSPITAL_BASED_OUTPATIENT_CLINIC_OR_DEPARTMENT_OTHER): Payer: Self-pay

## 2022-10-15 ENCOUNTER — Other Ambulatory Visit (HOSPITAL_BASED_OUTPATIENT_CLINIC_OR_DEPARTMENT_OTHER): Payer: Self-pay

## 2022-12-30 DIAGNOSIS — L400 Psoriasis vulgaris: Principal | ICD-10-CM

## 2022-12-30 MED ORDER — TALTZ AUTOINJECTOR 80 MG/ML SUBCUTANEOUS
3 refills | 0.00 days | Status: CP
Start: 2022-12-30 — End: ?

## 2022-12-30 NOTE — Unmapped (Signed)
 Called, left voicemail    Thanks  [place]

## 2023-03-16 ENCOUNTER — Other Ambulatory Visit (HOSPITAL_BASED_OUTPATIENT_CLINIC_OR_DEPARTMENT_OTHER): Payer: Self-pay

## 2023-03-16 MED ORDER — WEGOVY 2.4 MG/0.75ML ~~LOC~~ SOAJ
2.4000 mg | SUBCUTANEOUS | 2 refills | Status: DC
  Filled 2023-03-16: qty 3, 28d supply, fill #0

## 2023-03-17 ENCOUNTER — Other Ambulatory Visit: Payer: Self-pay

## 2023-03-17 ENCOUNTER — Other Ambulatory Visit (HOSPITAL_BASED_OUTPATIENT_CLINIC_OR_DEPARTMENT_OTHER): Payer: Self-pay

## 2023-03-17 MED ORDER — WEGOVY 2.4 MG/0.75ML ~~LOC~~ SOAJ
2.4000 mg | SUBCUTANEOUS | 0 refills | Status: AC
  Filled 2023-03-17: qty 9, 84d supply, fill #0

## 2023-03-18 ENCOUNTER — Other Ambulatory Visit (HOSPITAL_BASED_OUTPATIENT_CLINIC_OR_DEPARTMENT_OTHER): Payer: Self-pay

## 2023-04-12 NOTE — Unmapped (Unsigned)
 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   - Flaring off of medication for 2 months now, but otherwise reports taltz has worked significantly well.  - Continue ixekizumab (TALTZ AUTOINJECTOR) 80 mg/mL AtIn; SubQ: 1 80 mg every 4 weeks  - Continue clobetasol (TEMOVATE) 0.05 % ointment; Apply topically two (2) times a day. Apply to affected areas on body     High risk medication use (taltz)  - Negative Quant Gold on 01/2022  - Hep c/b, hiv negative on 03/2018     There are no diagnoses linked to this encounter.    The patient was advised to call for an appointment should any new, changing, or symptomatic lesions develop.     RTC: No follow-ups on file. or sooner as needed   _________________________________________________________________      Chief Complaint     No chief complaint on file.      HPI     Tyler Bradshaw is a 38 y.o. male who presents as a returning patient (last seen by Charlsie Quest, PA on 02/03/2022) to Dermatology for follow up of psoriasis. At last visit, patient was to continue taltz 80 mg every 4 weeks and start clobetasol 0.05% ointment for plaque psoriasis.    Psoriasis   - ***     The patient denies any other new or changing lesions or areas of concern.     Pertinent Past Medical History     No history of skin cancer    Family History:   Negative for melanoma    Past Medical History, Family History, Social History, Medication List, Allergies, and Problem List were reviewed in the rooming section of Epic.     ROS: Other than symptoms mentioned in the HPI, no fevers, chills, or other skin complaints    Physical Examination     GENERAL: Well-appearing male in no acute distress, resting comfortably.  NEURO: Alert and oriented, answers questions appropriately  PSYCH: Normal mood and affect  {PE extent:75514}  {PE list:75421}  ***    All areas not commented on are within normal limits or unremarkable    Scribe's Attestation: Gentry Fitz Robynn Pane) Natasha Bence, PA-C obtained and performed the history, physical exam and medical decision making elements that were entered into the chart. Signed by Sharol Harness, Scribe, on ***.    {*** NOTE TO PROVIDER: PLEASE ADD ATTESTATION NOTING YOU AGREE WITH SCRIBE DOCUMENTATION}      (Approved Template 09/24/2019)

## 2023-04-15 ENCOUNTER — Other Ambulatory Visit (HOSPITAL_BASED_OUTPATIENT_CLINIC_OR_DEPARTMENT_OTHER): Payer: Self-pay

## 2023-04-19 NOTE — Unmapped (Unsigned)
 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   - Flaring off of medication for 2 months now, but otherwise reports taltz has worked significantly well.  - Continue*** ixekizumab (TALTZ AUTOINJECTOR) 80 mg/mL AtIn; SubQ: 1 80 mg every 4 weeks  - Continue*** clobetasol (TEMOVATE) 0.05 % ointment; Apply topically two (2) times a day. Apply to affected areas on body     High risk medication use (taltz)  - Negative Quant Gold on 01/2022  - Hep c/b, hiv negative on 03/2018     There are no diagnoses linked to this encounter.    The patient was advised to call for an appointment should any new, changing, or symptomatic lesions develop.     RTC: No follow-ups on file. or sooner as needed   _________________________________________________________________      Chief Complaint     No chief complaint on file.      HPI     Tyler Bradshaw is a 38 y.o. male who presents as a returning patient (last seen by Nathanial Balboa, PA on 02/03/2022) to Dermatology for follow up of psoriasis. At last visit, patient was to continue taltz 80 mg every 4 weeks and start clobetasol 0.05% ointment for plaque psoriasis.    Psoriasis   - ***     The patient denies any other new or changing lesions or areas of concern.     Pertinent Past Medical History     No history of skin cancer    Family History:   Negative for melanoma    Past Medical History, Family History, Social History, Medication List, Allergies, and Problem List were reviewed in the rooming section of Epic.     ROS: Other than symptoms mentioned in the HPI, no fevers, chills, or other skin complaints    Physical Examination     GENERAL: Well-appearing male in no acute distress, resting comfortably.  NEURO: Alert and oriented, answers questions appropriately  PSYCH: Normal mood and affect  {PE extent:75514}  {PE list:75421}  ***    All areas not commented on are within normal limits or unremarkable    Scribe's Attestation: Timoteo Force Debbi Failing) Rodney Clamp, PA-C obtained and performed the history, physical exam and medical decision making elements that were entered into the chart. Signed by Aaron Aas, Scribe, on ***.    {*** NOTE TO PROVIDER: PLEASE ADD ATTESTATION NOTING YOU AGREE WITH SCRIBE DOCUMENTATION}        (Approved Template 09/24/2019)

## 2023-06-07 ENCOUNTER — Other Ambulatory Visit (HOSPITAL_BASED_OUTPATIENT_CLINIC_OR_DEPARTMENT_OTHER): Payer: Self-pay

## 2023-06-07 MED ORDER — WEGOVY 2.4 MG/0.75ML ~~LOC~~ SOAJ
2.4000 mg | SUBCUTANEOUS | 1 refills | Status: AC
  Filled 2023-06-07: qty 9, 84d supply, fill #0

## 2023-06-11 ENCOUNTER — Other Ambulatory Visit (HOSPITAL_BASED_OUTPATIENT_CLINIC_OR_DEPARTMENT_OTHER): Payer: Self-pay

## 2023-06-13 ENCOUNTER — Other Ambulatory Visit (HOSPITAL_BASED_OUTPATIENT_CLINIC_OR_DEPARTMENT_OTHER): Payer: Self-pay

## 2023-08-16 NOTE — Unmapped (Unsigned)
 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   - Flaring off of medication for 2 months now, but otherwise reports taltz  has worked significantly well.  - Continue ixekizumab  (TALTZ  AUTOINJECTOR) 80 mg/mL AtIn; SubQ: 1 80 mg every 4 weeks  - Start clobetasol  (TEMOVATE ) 0.05 % ointment; Apply topically two (2) times a day. Apply to affected areas on body     High risk medication use (taltz )  - Last quant gold neg 01/2022. Reordered today.   - Hep c/b, hiv negative on 03/2018    The patient was advised to call for an appointment should any new, changing, or symptomatic lesions develop.     RTC: No follow-ups on file. or sooner as needed   _________________________________________________________________      Chief Complaint     No chief complaint on file.      HPI     Tyler Bradshaw is a 38 y.o. male who presents as a returning patient (last seen 02/03/2022) to Dermatology for follow up of psoriasis.     History of Present Illness          The patient denies any other new or changing lesions or areas of concern.     Pertinent Past Medical History     {derm PMH :75456}    Problem List    None        Family History:   {derm skin check melanoma history:75452}    Past Medical History, Family History, Social History, Medication List, Allergies, and Problem List were reviewed in the rooming section of Epic.     ROS: Other than symptoms mentioned in the HPI, no fevers, chills, or other skin complaints    Physical Examination     GENERAL: Well-appearing male in no acute distress, resting comfortably.  NEURO: Alert and oriented, answers questions appropriately  PSYCH: Normal mood and affect  {PE extent:75514}  {PE list:75421}      All areas not commented on are within normal limits or unremarkable.           (Approved Template 09/24/2019)

## 2023-09-01 ENCOUNTER — Encounter
Admit: 2023-09-01 | Discharge: 2023-09-01 | Payer: BLUE CROSS/BLUE SHIELD | Attending: Student in an Organized Health Care Education/Training Program | Primary: Student in an Organized Health Care Education/Training Program

## 2023-09-01 DIAGNOSIS — Z79899 Other long term (current) drug therapy: Principal | ICD-10-CM

## 2023-09-01 DIAGNOSIS — L400 Psoriasis vulgaris: Principal | ICD-10-CM

## 2023-09-01 MED ORDER — TALTZ AUTOINJECTOR 80 MG/ML SUBCUTANEOUS
11 refills | 0.00000 days | Status: CP
Start: 2023-09-01 — End: ?

## 2023-09-01 NOTE — Unmapped (Signed)
 Dermatology Note     Assessment and Plan:      Plaque psoriasis, BSA 5% today (though limited by exam)  Chronic stable condition.  - Previously tried and failed: humira , topicals  - Diagnosis, treatment options, prognosis, risk/ benefit, and side effects of treatment were discussed with the patient.   - Flaring off of medication for 2 months now, but otherwise reports taltz  has worked significantly well.  - Continue ixekizumab  (TALTZ  AUTOINJECTOR) 80 mg/mL AtIn; SubQ: 1 80 mg every 4 weeks  - Continue clobetasol  (TEMOVATE ) 0.05 % ointment; Apply topically two (2) times a day. Apply to affected areas on body     High risk medication use (taltz )  - Last quant gold neg 03/2018. Repeat today.  - Hep c/b, hiv negative on 03/2018    The patient was advised to call for an appointment should any new, changing, or symptomatic lesions develop.     RTC: Return in about 11 months (around 07/31/2024). or sooner as needed   _________________________________________________________________      Chief Complaint     Chief Complaint   Patient presents with    Follow-up     Patient here for follow up on Plaque psoriasis. Patient doing well needs refills.       HPI     Tyler Bradshaw is a 38 y.o. male who presents as a returning patient (last seen 02/03/2022) to Dermatology for follow up of psoriasis. Doing well, skin clear. Infrequent use of clobetasol .    The patient denies any other new or changing lesions or areas of concern.     Physical Examination     GENERAL: Well-appearing male in no acute distress, resting comfortably.  SKIN (Sun exposed Exam): Per patient request, examination of the face, neck, scalp, bilateral upper extremities, palms, and fingernails was performed  - Hypopigmented patches on lower extremities (scarring from prev psoriasis)  - Otherwise clear    All areas not commented on are within normal limits or unremarkable      (Approved Template 09/24/2019)

## 2023-09-02 DIAGNOSIS — L400 Psoriasis vulgaris: Principal | ICD-10-CM

## 2023-09-02 LAB — QUANTIFERON TB GOLD PLUS
QUANTIFERON ANTIGEN 1 MINUS NIL: -0.07 [IU]/mL
QUANTIFERON ANTIGEN 2 MINUS NIL: -0.04 [IU]/mL
QUANTIFERON MITOGEN: 9.67 [IU]/mL
QUANTIFERON TB GOLD PLUS: NEGATIVE
QUANTIFERON TB NIL VALUE: 0.33 [IU]/mL

## 2023-09-02 LAB — TB NIL: TB NIL VALUE: 0.33

## 2023-09-02 LAB — TB AG1: TB AG1 VALUE: 0.26

## 2023-09-02 LAB — TB MITOGEN: TB MITOGEN VALUE: 10

## 2023-09-02 LAB — TB AG2: TB AG2 VALUE: 0.29

## 2023-09-02 MED ORDER — TALTZ AUTOINJECTOR 80 MG/ML SUBCUTANEOUS
11 refills | 0.00000 days
Start: 2023-09-02 — End: ?

## 2023-09-02 MED ORDER — CLOBETASOL 0.05 % TOPICAL OINTMENT
Freq: Two times a day (BID) | TOPICAL | 2 refills | 0.00000 days
Start: 2023-09-02 — End: ?

## 2023-09-02 NOTE — Unmapped (Signed)
 Voicemail Received    Message  Patient called regarding a prescription refill. He was seen in the office on 09/01/2023. Patient reports that the doctor sent the prescription, but the [facility] for [facility] requires prior authorization before the refill can be processed. Patient requests that the prior authorization be sent to the pharmacy as soon as possible so he can obtain his medication.    Patient has also sent a MyChart message regarding the same issue. Advised patient that sending multiple messages (voicemail and MyChart) may delay processing and that all requests are handled in order of received.    Callback phone number: [phone]    Action   Prior authorization  Initiated via EPA in referral section; pending approval/denial.    Mychart message sent to pt

## 2023-09-02 NOTE — Unmapped (Signed)
 Erin,    Patient called the nurse line very concerned that his PA would not be completed quickly because that has happened in the past and it took 8 months to get his skin under control.  Erin please work on this today and message patient when its been sent.     TY Darice

## 2023-09-06 NOTE — Unmapped (Signed)
 FEP Prior Approval Program  Nurse spoke with [name]., Clinical Care Representative, who provided verbal prior authorization to the following questions:  Member ID: [id number]  Prescriber NPI: [id number]  Address: [address], [address], [place], [place] [address]  Phone: [phone] - Fax: [phone]    Medication: Taltz  - Continuation    Diagnosis: Plaque Psoriasis [L40.0]    Questions addressed:  Prescriber agrees not to exceed the FDA-labeled maintenance dose of 80 mg every 4 weeks.  Condition has shown improvement.  Prescriber agrees to monitor for ulcerative colitis and discontinue therapy if necessary.  Patient will not receive live vaccines while on therapy.  Therapy will not be used in combination with other biologics.    Status:  Approval granted for 18 months (08/07/2023 - 03/04/2025).  Fax confirmation will be sent in 24 hours
